# Patient Record
Sex: Male | Born: 1950 | Race: Black or African American | Hispanic: No | Marital: Married | Smoking: Former smoker
Health system: Southern US, Community
[De-identification: ages and names within clinical notes are randomized; demographics above are authoritative.]

## PROBLEM LIST (undated history)

## (undated) DIAGNOSIS — Z972 Presence of dental prosthetic device (complete) (partial): Secondary | ICD-10-CM

## (undated) DIAGNOSIS — C801 Malignant (primary) neoplasm, unspecified: Secondary | ICD-10-CM

## (undated) DIAGNOSIS — M109 Gout, unspecified: Secondary | ICD-10-CM

## (undated) DIAGNOSIS — M1990 Unspecified osteoarthritis, unspecified site: Secondary | ICD-10-CM

## (undated) HISTORY — DX: Unspecified osteoarthritis, unspecified site: M19.90

## (undated) HISTORY — DX: Presence of dental prosthetic device (complete) (partial): Z97.2

---

## 2000-10-20 HISTORY — PX: KNEE SURGERY: SHX244

## 2001-08-25 ENCOUNTER — Encounter: Payer: Self-pay | Admitting: Family Medicine

## 2001-08-25 ENCOUNTER — Ambulatory Visit (HOSPITAL_COMMUNITY): Admission: RE | Admit: 2001-08-25 | Discharge: 2001-08-25 | Payer: Self-pay | Admitting: Family Medicine

## 2006-08-27 ENCOUNTER — Emergency Department (HOSPITAL_COMMUNITY): Admission: EM | Admit: 2006-08-27 | Discharge: 2006-08-27 | Payer: Self-pay | Admitting: Emergency Medicine

## 2009-08-15 ENCOUNTER — Encounter: Admission: RE | Admit: 2009-08-15 | Discharge: 2009-08-15 | Payer: Self-pay | Admitting: Family Medicine

## 2009-10-20 HISTORY — PX: HERNIA REPAIR: SHX51

## 2010-05-08 ENCOUNTER — Emergency Department (HOSPITAL_COMMUNITY): Admission: EM | Admit: 2010-05-08 | Discharge: 2010-05-08 | Payer: Self-pay | Admitting: Emergency Medicine

## 2010-05-30 ENCOUNTER — Encounter: Admission: RE | Admit: 2010-05-30 | Discharge: 2010-05-30 | Payer: Self-pay | Admitting: General Surgery

## 2010-08-13 IMAGING — CR DG CHEST 2V
2 series · 2 of 2 positions shown · non-contrast
Comparison: Chest x-ray of 08/27/2006

CLINICAL DATA: Preop for surgery for repair of inguinal hernia

CHEST - 2 VIEW

[w chest pa]
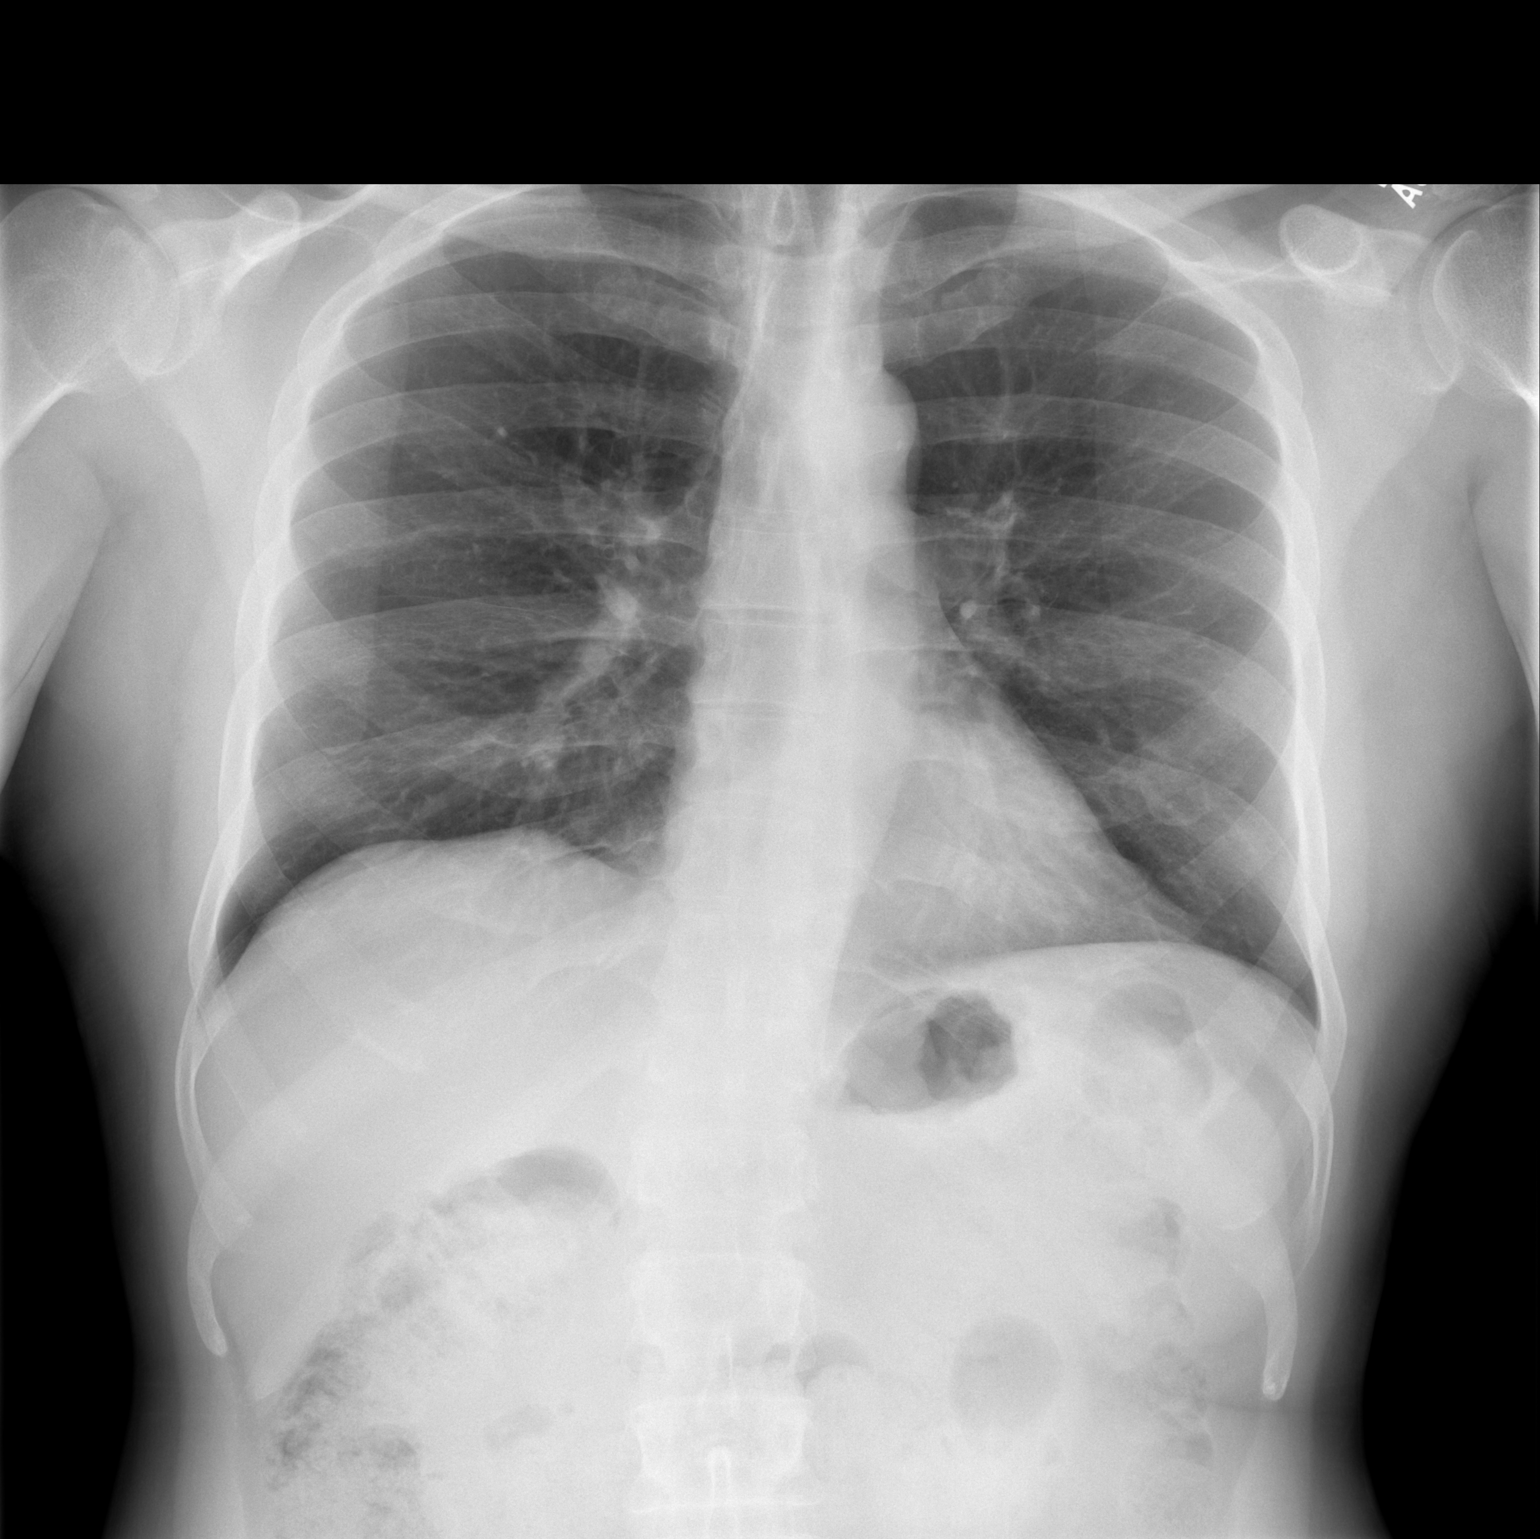

[w chest lat]
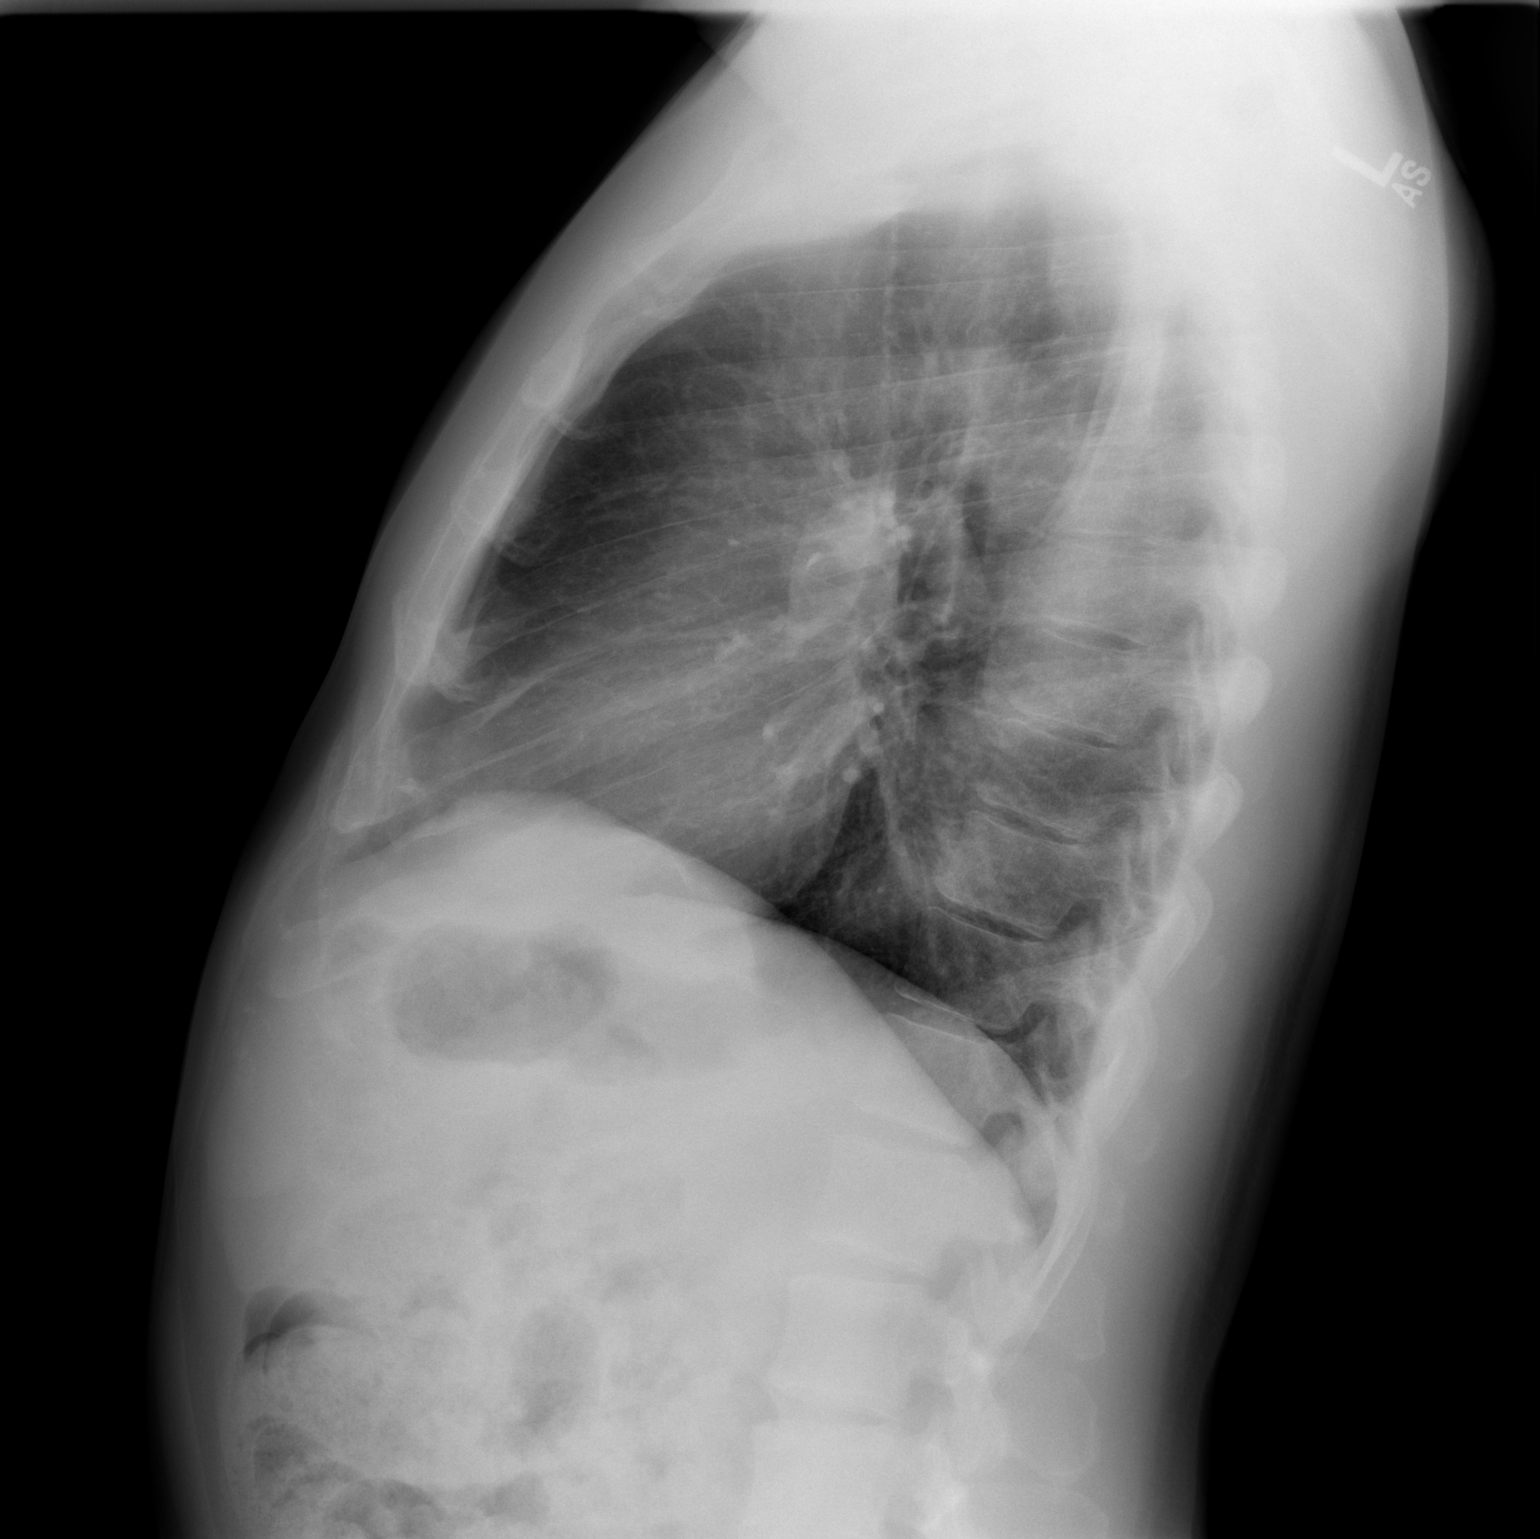

[2 of 2 positions shown; findings below may reference images not displayed]

FINDINGS: The lungs are clear.  Mediastinal contours are normal.
The heart is within normal limits in size.  No bony abnormality is
seen.
IMPRESSION: No active lung disease.

## 2010-11-10 ENCOUNTER — Encounter: Payer: Self-pay | Admitting: Family Medicine

## 2011-01-04 LAB — COMPREHENSIVE METABOLIC PANEL
Albumin: 3.9 g/dL (ref 3.5–5.2)
BUN: 11 mg/dL (ref 6–23)
Calcium: 9 mg/dL (ref 8.4–10.5)
GFR calc Af Amer: >60 mL/min (ref >60)
GFR calc non Af Amer: >60 mL/min (ref >60)
Glucose, Bld: 104 mg/dL — ABNORMAL HIGH (ref 70–99)
Potassium: 3.9 mEq/L (ref 3.5–5.1)
Sodium: 138 mEq/L (ref 135–145)

## 2011-01-04 LAB — DIFFERENTIAL
Basophils Relative: 0 % (ref 0–1)
Eosinophils Relative: 2 % (ref 0–5)
Lymphocytes Relative: 14 % (ref 12–46)
Lymphs Abs: 0.9 10*3/uL (ref 0.7–4.0)
Monocytes Absolute: 0.4 10*3/uL (ref 0.1–1.0)

## 2011-01-04 LAB — CBC
Hemoglobin: 15.2 g/dL (ref 13.0–17.0)
MCH: 28.9 pg (ref 26.0–34.0)
Platelets: 155 10*3/uL (ref 150–400)
RBC: 5.26 MIL/uL (ref 4.22–5.81)
RDW: 14.1 % (ref 11.5–15.5)
WBC: 6.6 10*3/uL (ref 4.0–10.5)

## 2011-03-21 ENCOUNTER — Encounter (INDEPENDENT_AMBULATORY_CARE_PROVIDER_SITE_OTHER): Payer: Self-pay | Admitting: General Surgery

## 2015-07-06 ENCOUNTER — Other Ambulatory Visit: Payer: Self-pay | Admitting: Urology

## 2015-08-24 NOTE — Patient Instructions (Addendum)
Gregory Rhodes  08/24/2015   Your procedure is scheduled on: 08-30-15  Report to [facility]  Entrance take [facility]  elevators to 3rd floor to  [place] at 900 AM.  Call this number if you have problems the morning of surgery [phone]   Remember: ONLY 1 PERSON MAY GO WITH YOU TO SHORT STAY TO GET  READY MORNING OF [place].  Do not eat food or drink liquids :After Midnight.   FOLLOW ANY BOWEL PREP INSTRUCTIONS FROM DR Gregory Rhodes , USE FLEET'S ENEMA NIGHT BEFORE SURGERY !              FOLLOW A CLEAR LIQUID DIET ON Wednesday 08/29/2015 ALL DAY WITH BOWEL PREP!     CLEAR LIQUID DIET   Foods Allowed                                                                     Foods Excluded  Coffee and tea, regular and decaf                             liquids that you cannot  Plain Jell-O in any flavor                                             see through such as: Fruit ices (not with fruit pulp)                                     milk, soups, orange juice  Iced Popsicles                                    All solid food Carbonated beverages, regular and diet                                    Cranberry, grape and apple juices Sports drinks like Gatorade Lightly seasoned clear broth or consume(fat free) Sugar, honey syrup  Sample Menu Breakfast                                Lunch                                     Supper Cranberry juice                    Beef broth                            Chicken broth Jell-O  Grape juice                           Apple juice Coffee or tea                        Jell-O                                      Popsicle                                                Coffee or tea                        Coffee or tea  _____________________________________________________________________     Take these medicines the morning of surgery with A SIP OF WATER:Allopuirinol                                 You may not have any metal on your body including hair pins and              piercings  Do not wear jewelry, make-up, lotions, powders or perfumes, deodorant             Do not wear nail polish.  Do not shave  48 hours prior to surgery.              Men may shave face and neck.   Do not bring valuables to the hospital. Gregory Rhodes.  Contacts, dentures or bridgework may not be worn into surgery.  Leave suitcase in the car. After surgery it may be brought to your room.     Patients discharged the day of surgery will not be allowed to drive home.  Name and phone number of your driver:  Special Instructions: N/A              Please read over the following fact sheets you were given: _____________________________________________________________________             [facility] - Preparing for Surgery Before surgery, you can play an important role.  Because skin is not sterile, your skin needs to be as free of germs as possible.  You can reduce the number of germs on your skin by washing with CHG (chlorahexidine gluconate) soap before surgery.  CHG is an antiseptic cleaner which kills germs and bonds with the skin to continue killing germs even after washing. Please DO NOT use if you have an allergy to CHG or antibacterial soaps.  If your skin becomes reddened/irritated stop using the CHG and inform your nurse when you arrive at Short Stay. Do not shave (including legs and underarms) for at least 48 hours prior to the first CHG shower.  You may shave your face/neck. Please follow these instructions carefully:  1.  Shower with CHG Soap the night before surgery and the  morning of Surgery.  2.  If you choose to wash your hair, wash your hair first as usual with your  normal  shampoo.  3.  After you shampoo, rinse your hair and body thoroughly to remove the  shampoo.                           4.  Use CHG as you would any other liquid  soap.  You can apply chg directly  to the skin and wash                       Gently with a scrungie or clean washcloth.  5.  Apply the CHG Soap to your body ONLY FROM THE NECK DOWN.   Do not use on face/ open                           Wound or open sores. Avoid contact with eyes, ears mouth and genitals (private parts).                       Wash face,  Genitals (private parts) with your normal soap.             6.  Wash thoroughly, paying special attention to the area where your surgery  will be performed.  7.  Thoroughly rinse your body with warm water from the neck down.  8.  DO NOT shower/wash with your normal soap after using and rinsing off  the CHG Soap.                9.  Pat yourself dry with a clean towel.            10.  Wear clean pajamas.            11.  Place clean sheets on your bed the night of your first shower and do not  sleep with pets. Day of Surgery : Do not apply any lotions/deodorants the morning of surgery.  Please wear clean clothes to the hospital/surgery center.  FAILURE TO FOLLOW THESE INSTRUCTIONS MAY RESULT IN THE CANCELLATION OF YOUR SURGERY PATIENT SIGNATURE_________________________________  NURSE SIGNATURE__________________________________  ________________________________________________________________________   Gregory Rhodes  An incentive spirometer is a tool that can help keep your lungs clear and active. This tool measures how well you are filling your lungs with each breath. Taking long deep breaths may help reverse or decrease the chance of developing breathing (pulmonary) problems (especially infection) following:  A long period of time when you are unable to move or be active. BEFORE THE PROCEDURE   If the spirometer includes an indicator to show your best effort, your nurse or respiratory therapist will set it to a desired goal.  If possible, sit up straight or lean slightly forward. Try not to slouch.  Hold the incentive spirometer  in an upright position. INSTRUCTIONS FOR USE   Sit on the edge of your bed if possible, or sit up as far as you can in bed or on a chair.  Hold the incentive spirometer in an upright position.  Breathe out normally.  Place the mouthpiece in your mouth and seal your lips tightly around it.  Breathe in slowly and as deeply as possible, raising the piston or the ball toward the top of the column.  Hold your breath for 3-5 seconds or for as long as possible. Allow the piston or ball to fall to the bottom of the column.  Remove the mouthpiece from your mouth and breathe out normally.  Rest for a few seconds and repeat Steps 1 through 7 at least 10 times every 1-2 hours when you are awake. Take your time and take a few normal breaths between deep breaths.  The spirometer may include an indicator to show your best effort. Use the indicator as a goal to work toward during each repetition.  After each set of 10 deep breaths, practice coughing to be sure your lungs are clear. If you have an incision (the cut made at the time of surgery), support your incision when coughing by placing a pillow or rolled up towels firmly against it. Once you are able to get out of bed, walk around indoors and cough well. You may stop using the incentive spirometer when instructed by your caregiver.  RISKS AND COMPLICATIONS  Take your time so you do not get dizzy or light-headed.  If you are in pain, you may need to take or ask for pain medication before doing incentive spirometry. It is harder to take a deep breath if you are having pain. AFTER USE  Rest and breathe slowly and easily.  It can be helpful to keep track of a log of your progress. Your caregiver can provide you with a simple table to help with this. If you are using the spirometer at home, follow these instructions: [place] IF:   You are having difficultly using the spirometer.  You have trouble using the spirometer as often as  instructed.  Your pain medication is not giving enough relief while using the spirometer.  You develop fever of 100.5 F (38.1 C) or higher. SEEK IMMEDIATE MEDICAL CARE IF:   You cough up bloody sputum that had not been present before.  You develop fever of 102 F (38.9 C) or greater.  You develop worsening pain at or near the incision site. MAKE SURE YOU:   Understand these instructions.  Will watch your condition.  Will get help right away if you are not doing well or get worse. Document Released: 02/16/2007 Document Revised: 12/29/2011 Document Reviewed: 04/19/2007 ExitCare Patient Information 2014 ExitCare, [place].   ________________________________________________________________________  WHAT IS A BLOOD TRANSFUSION? Blood Transfusion Information  A transfusion is the replacement of blood or some of its parts. Blood is made up of multiple cells which provide different functions.  Red blood cells carry oxygen and are used for blood loss replacement.  White blood cells fight against infection.  Platelets control bleeding.  Plasma helps clot blood.  Other blood products are available for specialized needs, such as hemophilia or other clotting disorders. BEFORE THE TRANSFUSION  Who gives blood for transfusions?   Healthy volunteers who are fully evaluated to make sure their blood is safe. This is blood bank blood. Transfusion therapy is the safest it has ever been in the practice of medicine. Before blood is taken from a donor, a complete history is taken to make sure that person has no history of diseases nor engages in risky social behavior (examples are intravenous drug use or sexual activity with multiple partners). The donor's travel history is screened to minimize risk of transmitting infections, such as malaria. The donated blood is tested for signs of infectious diseases, such as HIV and hepatitis. The blood is then tested to be sure it is compatible with you in  order to minimize the chance of a transfusion reaction. If you or a relative donates blood, this is often done in anticipation of surgery and is not appropriate for emergency situations. It takes  many days to process the donated blood. RISKS AND COMPLICATIONS Although transfusion therapy is very safe and saves many lives, the main dangers of transfusion include:   Getting an infectious disease.  Developing a transfusion reaction. This is an allergic reaction to something in the blood you were given. Every precaution is taken to prevent this. The decision to have a blood transfusion has been considered carefully by your caregiver before blood is given. Blood is not given unless the benefits outweigh the risks. AFTER THE TRANSFUSION  Right after receiving a blood transfusion, you will usually feel much better and more energetic. This is especially true if your red blood cells have gotten low (anemic). The transfusion raises the level of the red blood cells which carry oxygen, and this usually causes an energy increase.  The nurse administering the transfusion will monitor you carefully for complications. HOME CARE INSTRUCTIONS  No special instructions are needed after a transfusion. You may find your energy is better. Speak with your caregiver about any limitations on activity for underlying diseases you may have. SEEK MEDICAL CARE IF:   Your condition is not improving after your transfusion.  You develop redness or irritation at the intravenous (IV) site. SEEK IMMEDIATE MEDICAL CARE IF:  Any of the following symptoms occur over the next 12 hours:  Shaking chills.  You have a temperature by mouth above 102 F (38.9 C), not controlled by medicine.  Chest, back, or muscle pain.  People around you feel you are not acting correctly or are confused.  Shortness of breath or difficulty breathing.  Dizziness and fainting.  You get a rash or develop hives.  You have a decrease in urine  output.  Your urine turns a dark color or changes to pink, red, or brown. Any of the following symptoms occur over the next 10 days:  You have a temperature by mouth above 102 F (38.9 C), not controlled by medicine.  Shortness of breath.  Weakness after normal activity.  The white part of the eye turns yellow (jaundice).  You have a decrease in the amount of urine or are urinating less often.  Your urine turns a dark color or changes to pink, red, or brown. Document Released: 10/03/2000 Document Revised: 12/29/2011 Document Reviewed: 05/22/2008 [facility] Patient Information 2014 [place], [place].  _______________________________________________________________________

## 2015-08-27 ENCOUNTER — Encounter (HOSPITAL_COMMUNITY)
Admission: RE | Admit: 2015-08-27 | Discharge: 2015-08-27 | Disposition: A | Discharge status: Home / Self Care | Payer: Commercial Managed Care - PPO | Source: Ambulatory Visit | Attending: Urology | Admitting: Urology

## 2015-08-27 ENCOUNTER — Encounter (HOSPITAL_COMMUNITY): Payer: Self-pay

## 2015-08-27 HISTORY — DX: Malignant (primary) neoplasm, unspecified: C80.1

## 2015-08-27 LAB — BASIC METABOLIC PANEL
Anion gap: 8 (ref 5–15)
BUN: 15 mg/dL (ref 6–20)
CALCIUM: 9.5 mg/dL (ref 8.9–10.3)
CO2: 25 mmol/L (ref 22–32)
CREATININE: 1.13 mg/dL (ref 0.61–1.24)
Chloride: 109 mmol/L (ref 101–111)
GFR calc Af Amer: >60 mL/min (ref >60)
GLUCOSE: 86 mg/dL (ref 65–99)
Potassium: 4.3 mmol/L (ref 3.5–5.1)
Sodium: 142 mmol/L (ref 135–145)

## 2015-08-27 LAB — CBC
HCT: 45.6 % (ref 39.0–52.0)
Hemoglobin: 16.1 g/dL (ref 13.0–17.0)
MCH: 30 pg (ref 26.0–34.0)
MCHC: 35.3 g/dL (ref 30.0–36.0)
MCV: 84.9 fL (ref 78.0–100.0)
Platelets: 156 10*3/uL (ref 150–400)
RBC: 5.37 MIL/uL (ref 4.22–5.81)
RDW: 14 % (ref 11.5–15.5)
WBC: 6.2 10*3/uL (ref 4.0–10.5)

## 2015-08-27 LAB — ABO/RH: ABO/RH(D): O POS

## 2015-08-29 MED ORDER — FLEET ENEMA 7-19 GM/118ML RE ENEM: 1.0000 | ENEMA | Freq: Once | RECTAL | Status: DC

## 2015-08-29 MED ORDER — MAGNESIUM CITRATE PO SOLN
1.0000 | Freq: Once | ORAL | Status: DC
  Filled 2015-08-29: qty 296

## 2015-08-29 NOTE — H&P (Signed)
Chief Complaint Prostate Cancer   Reason For Visit Reason for consult: To discuss treatment options for prostate cancer and specifically to consider a robotic prostatectomy.  Physician requesting consult: Dr. Kathie Rhodes  PCP: Dr. Antony Contras   History of Present Illness Gregory Rhodes is a 64 year old gentleman who was found to have an elevated PSA of 16.2 prompting urologic evaluation by Dr. Karsten Ro. He underwent a prostate needle biopsy on 05/22/15 that demonstrated Gleason 3+4=7 adenocarcinoma of the prostate with 5 out of 12 biopsy cores positive for malignancy. He has no family history of prostate cancer.    His past surgical history is significant for a laparoscopic hernia repair in 2011.    TNM stage: cT1c Nx Mx  PSA: 16.2  Gleason score: 3+4=7  Biopsy (05/22/15): 5/12 cores positive    Left: L lateral apex (30%, 3+3=6), L lateral mid (80%, 3+3=6)    Right: R mid (10%, 3+4=7), R lateral mid (50%, 3+3=6), R lateral base (20%, 3+4=7)  Prostate volume: 25.2 cc    Nomogram  OC disease: 33%  EPE: 65%  SVI: 5%  LNI: 4%  PFS (surgery): 78% at 5 years, 65% at 10 years    Urinary function:   Erectile function:   Past Medical History Problems  1. History of Herniated disc 2. History of gout (Z87.39)  Surgical History Problems  1. History of Biopsy Of The Prostate Needle 2. History of Inguinal Hernia Repair 3. History of Knee Surgery Right  Current Meds 1. Allopurinol 100 MG Oral Tablet;  Therapy: (Recorded:14Jun2016) to Recorded  Allergies Medication  1. No Known Drug Allergies  Family History Problems  1. No pertinent family history : Mother  Social History Problems    Denied: History of Alcohol use   Former smoker [phone])   [id number] years quit in 1983   Number of children   1 son   Occupation   Freight forwarder at [employer], constitutional, skin, eye, otolaryngeal, hematologic/lymphatic, cardiovascular,  pulmonary, endocrine, musculoskeletal, gastrointestinal, neurological and psychiatric system(s) were reviewed and pertinent findings if present are noted and are otherwise negative.    Vitals Vital Signs [Data Includes: Last 1 Day]  Recorded: 13Sep2016 08:02AM  Blood Pressure: 137 / 74 Heart Rate: 62  Physical Exam Constitutional: Well nourished and well developed . No acute distress.  ENT:. The ears and nose are normal in appearance.  Neck: The appearance of the neck is normal and no neck mass is present.  Pulmonary: No respiratory distress, normal respiratory rhythm and effort and clear bilateral breath sounds.  Cardiovascular: Heart rate and rhythm are normal . No peripheral edema.  Abdomen: The abdomen is soft and nontender. No masses are palpated. No CVA tenderness. No hernias are palpable. No hepatosplenomegaly noted.  Rectal: Rectal exam demonstrates normal sphincter tone, no tenderness and no masses. He does have a subtle nodule toward the right mid apex which is certainly organ confined medially. The prostate is not tender. The left seminal vesicle is nonpalpable. The right seminal vesicle is nonpalpable. The perineum is normal on inspection.  Lymphatics: The femoral and inguinal nodes are not enlarged or tender.  Skin: Normal skin turgor, no visible rash and no visible skin lesions.  Neuro/Psych:. Mood and affect are appropriate.    Results/Data Urine [Data Includes: Last 1 Day]   13Sep2016  COLOR YELLOW   APPEARANCE CLEAR   SPECIFIC GRAVITY 1.020   pH 6.0   GLUCOSE NEGATIVE   BILIRUBIN NEGATIVE   KETONE  NEGATIVE   BLOOD NEGATIVE   PROTEIN NEGATIVE   NITRITE NEGATIVE   LEUKOCYTE ESTERASE NEGATIVE    I have reviewed his medical records, PSA results, and pathology results. Findings are as dictated above.   Assessment Assessed  1. Adenocarcinoma of prostate (C61)  Plan Adenocarcinoma of prostate  1. Follow-up Office  Follow-up  Status: Complete  Done: 13Sep2016 2.  PT/OT Referral Referral  Referral  Status: Hold For - PreCert,Date of Service,Physical  Therapy  Requested for: 21Sep2016 Health Maintenance  3. UA With REFLEX; [Do Not Release]; Status:Complete;   Done: [id number] 07:49AM  Discussion/Summary 1. Intermediate risk clinically localized prostate cancer: He has elected to proceed with surgical treatment. He will undergo a bilateral nerve sparing robot-assisted laparoscopic radical prostatectomy and pelvic lymphadenectomy. He did undergo a prior laparoscopic hernia repair and we did discuss the specific increased risks of possible open surgical conversion, bladder injury, or vascular injury related to his prior surgical procedure.

## 2015-08-30 ENCOUNTER — Inpatient Hospital Stay (HOSPITAL_COMMUNITY): Payer: Commercial Managed Care - PPO | Admitting: Registered Nurse

## 2015-08-30 ENCOUNTER — Encounter (HOSPITAL_COMMUNITY)
Admission: RE | Disposition: A | Discharge status: Home / Self Care | Payer: Self-pay | Source: Ambulatory Visit | Attending: Urology

## 2015-08-30 ENCOUNTER — Encounter (HOSPITAL_COMMUNITY): Payer: Self-pay | Admitting: *Deleted

## 2015-08-30 ENCOUNTER — Inpatient Hospital Stay (HOSPITAL_COMMUNITY)
Admission: RE | Admit: 2015-08-30 | Discharge: 2015-08-31 | DRG: 708 | Disposition: A | Discharge status: Home / Self Care | Payer: Commercial Managed Care - PPO | Source: Ambulatory Visit | Attending: Urology | Admitting: Urology

## 2015-08-30 DIAGNOSIS — Z87891 Personal history of nicotine dependence: Secondary | ICD-10-CM | POA: Diagnosis not present

## 2015-08-30 DIAGNOSIS — K660 Peritoneal adhesions (postprocedural) (postinfection): Secondary | ICD-10-CM | POA: Diagnosis present

## 2015-08-30 DIAGNOSIS — Z01812 Encounter for preprocedural laboratory examination: Secondary | ICD-10-CM | POA: Diagnosis not present

## 2015-08-30 DIAGNOSIS — C61 Malignant neoplasm of prostate: Secondary | ICD-10-CM | POA: Diagnosis present

## 2015-08-30 HISTORY — PX: LYMPHADENECTOMY: SHX5960

## 2015-08-30 HISTORY — PX: ROBOT ASSISTED LAPAROSCOPIC RADICAL PROSTATECTOMY: SHX5141

## 2015-08-30 HISTORY — DX: Gout, unspecified: M10.9

## 2015-08-30 LAB — TYPE AND SCREEN
ABO/RH(D): O POS
ANTIBODY SCREEN: NEGATIVE

## 2015-08-30 LAB — HEMOGLOBIN AND HEMATOCRIT, BLOOD
HEMATOCRIT: 44.5 % (ref 39.0–52.0)
HEMOGLOBIN: 15.4 g/dL (ref 13.0–17.0)

## 2015-08-30 SURGERY — ROBOTIC ASSISTED LAPAROSCOPIC RADICAL PROSTATECTOMY LEVEL 3
Anesthesia: General

## 2015-08-30 MED ORDER — PROPOFOL 10 MG/ML IV BOLUS
INTRAVENOUS | Status: DC | PRN
  Administered 2015-08-30: 140 mg via INTRAVENOUS

## 2015-08-30 MED ORDER — KCL IN DEXTROSE-NACL 20-5-0.45 MEQ/L-%-% IV SOLN
INTRAVENOUS | Status: DC
  Administered 2015-08-30 – 2015-08-31 (×3): via INTRAVENOUS
  Filled 2015-08-30 (×4): qty 1000

## 2015-08-30 MED ORDER — SULFAMETHOXAZOLE-TRIMETHOPRIM 800-160 MG PO TABS: 1.0000 | ORAL_TABLET | Freq: Two times a day (BID) | ORAL | Status: AC

## 2015-08-30 MED ORDER — HYDROMORPHONE HCL 1 MG/ML IJ SOLN
INTRAMUSCULAR | Status: AC
  Filled 2015-08-30: qty 1

## 2015-08-30 MED ORDER — DOCUSATE SODIUM 100 MG PO CAPS
100.0000 mg | ORAL_CAPSULE | Freq: Two times a day (BID) | ORAL | Status: DC
  Administered 2015-08-30 – 2015-08-31 (×2): 100 mg via ORAL
  Filled 2015-08-30 (×3): qty 1

## 2015-08-30 MED ORDER — FENTANYL CITRATE (PF) 100 MCG/2ML IJ SOLN
INTRAMUSCULAR | Status: DC | PRN
  Administered 2015-08-30 (×2): 50 ug via INTRAVENOUS
  Administered 2015-08-30: 100 ug via INTRAVENOUS
  Administered 2015-08-30: 50 ug via INTRAVENOUS

## 2015-08-30 MED ORDER — DIPHENHYDRAMINE HCL 50 MG/ML IJ SOLN: 12.5000 mg | Freq: Four times a day (QID) | INTRAMUSCULAR | Status: DC | PRN

## 2015-08-30 MED ORDER — MIDAZOLAM HCL 5 MG/5ML IJ SOLN
INTRAMUSCULAR | Status: DC | PRN
  Administered 2015-08-30: 2 mg via INTRAVENOUS

## 2015-08-30 MED ORDER — MIDAZOLAM HCL 2 MG/2ML IJ SOLN
INTRAMUSCULAR | Status: AC
  Filled 2015-08-30: qty 4

## 2015-08-30 MED ORDER — SODIUM CHLORIDE 0.9 % IR SOLN
Status: DC | PRN
  Administered 2015-08-30: 1000 mL via INTRAVESICAL

## 2015-08-30 MED ORDER — NEOSTIGMINE METHYLSULFATE 10 MG/10ML IV SOLN
INTRAVENOUS | Status: DC | PRN
  Administered 2015-08-30: 4 mg via INTRAVENOUS

## 2015-08-30 MED ORDER — LACTATED RINGERS IV SOLN
INTRAVENOUS | Status: DC
  Administered 2015-08-30: 14:00:00 via INTRAVENOUS
  Administered 2015-08-30: 1000 mL via INTRAVENOUS

## 2015-08-30 MED ORDER — HYDROMORPHONE HCL 1 MG/ML IJ SOLN
0.2500 mg | INTRAMUSCULAR | Status: DC | PRN
  Administered 2015-08-30 (×4): 0.5 mg via INTRAVENOUS

## 2015-08-30 MED ORDER — PROPOFOL 10 MG/ML IV BOLUS
INTRAVENOUS | Status: AC
  Filled 2015-08-30: qty 20

## 2015-08-30 MED ORDER — HYDROMORPHONE HCL 2 MG/ML IJ SOLN
INTRAMUSCULAR | Status: AC
  Filled 2015-08-30: qty 1

## 2015-08-30 MED ORDER — LIDOCAINE HCL (CARDIAC) 20 MG/ML IV SOLN
INTRAVENOUS | Status: DC | PRN
  Administered 2015-08-30: 50 mg via INTRAVENOUS

## 2015-08-30 MED ORDER — LACTATED RINGERS IV SOLN
INTRAVENOUS | Status: DC | PRN
  Administered 2015-08-30: 12:00:00

## 2015-08-30 MED ORDER — SODIUM CHLORIDE 0.9 % IV BOLUS (SEPSIS)
1000.0000 mL | Freq: Once | INTRAVENOUS | Status: AC
  Administered 2015-08-30: 1000 mL via INTRAVENOUS

## 2015-08-30 MED ORDER — BUPIVACAINE-EPINEPHRINE (PF) 0.25% -1:200000 IJ SOLN
INTRAMUSCULAR | Status: AC
  Filled 2015-08-30: qty 30

## 2015-08-30 MED ORDER — CEFAZOLIN SODIUM-DEXTROSE 2-3 GM-% IV SOLR
2.0000 g | INTRAVENOUS | Status: AC
  Administered 2015-08-30: 2 g via INTRAVENOUS

## 2015-08-30 MED ORDER — ROCURONIUM BROMIDE 100 MG/10ML IV SOLN
INTRAVENOUS | Status: DC | PRN
  Administered 2015-08-30: 10 mg via INTRAVENOUS
  Administered 2015-08-30: 50 mg via INTRAVENOUS

## 2015-08-30 MED ORDER — STERILE WATER FOR IRRIGATION IR SOLN
Status: DC | PRN
  Administered 2015-08-30: 1000 mL

## 2015-08-30 MED ORDER — MORPHINE SULFATE (PF) 2 MG/ML IV SOLN
2.0000 mg | INTRAVENOUS | Status: DC | PRN
  Administered 2015-08-30 (×2): 2 mg via INTRAVENOUS
  Administered 2015-08-31: 4 mg via INTRAVENOUS
  Administered 2015-08-31: 2 mg via INTRAVENOUS
  Administered 2015-08-31 (×3): 4 mg via INTRAVENOUS
  Filled 2015-08-30 (×3): qty 2
  Filled 2015-08-30 (×2): qty 1
  Filled 2015-08-30: qty 2
  Filled 2015-08-30: qty 1

## 2015-08-30 MED ORDER — ACETAMINOPHEN 325 MG PO TABS: 650.0000 mg | ORAL_TABLET | ORAL | Status: DC | PRN

## 2015-08-30 MED ORDER — KCL IN DEXTROSE-NACL 20-5-0.45 MEQ/L-%-% IV SOLN
INTRAVENOUS | Status: AC
  Filled 2015-08-30: qty 1000

## 2015-08-30 MED ORDER — ONDANSETRON HCL 4 MG/2ML IJ SOLN
INTRAMUSCULAR | Status: DC | PRN
  Administered 2015-08-30: 4 mg via INTRAVENOUS

## 2015-08-30 MED ORDER — ONDANSETRON HCL 4 MG/2ML IJ SOLN
INTRAMUSCULAR | Status: AC
  Filled 2015-08-30: qty 2

## 2015-08-30 MED ORDER — LIDOCAINE HCL (CARDIAC) 20 MG/ML IV SOLN
INTRAVENOUS | Status: AC
Start: 2015-08-30 — End: 2015-08-30
  Filled 2015-08-30: qty 5

## 2015-08-30 MED ORDER — SUCCINYLCHOLINE CHLORIDE 20 MG/ML IJ SOLN
INTRAMUSCULAR | Status: DC | PRN
  Administered 2015-08-30: 80 mg via INTRAVENOUS

## 2015-08-30 MED ORDER — GLYCOPYRROLATE 0.2 MG/ML IJ SOLN
INTRAMUSCULAR | Status: DC | PRN
  Administered 2015-08-30: 0.6 mg via INTRAVENOUS

## 2015-08-30 MED ORDER — ROCURONIUM BROMIDE 100 MG/10ML IV SOLN
INTRAVENOUS | Status: AC
  Filled 2015-08-30: qty 1

## 2015-08-30 MED ORDER — BELLADONNA ALKALOIDS-OPIUM 16.2-60 MG RE SUPP
1.0000 | Freq: Four times a day (QID) | RECTAL | Status: DC | PRN
  Administered 2015-08-30: 1 via RECTAL
  Filled 2015-08-30: qty 1

## 2015-08-30 MED ORDER — DIPHENHYDRAMINE HCL 12.5 MG/5ML PO ELIX: 12.5000 mg | ORAL_SOLUTION | Freq: Four times a day (QID) | ORAL | Status: DC | PRN

## 2015-08-30 MED ORDER — CEFAZOLIN SODIUM-DEXTROSE 2-3 GM-% IV SOLR
INTRAVENOUS | Status: AC
  Filled 2015-08-30: qty 50

## 2015-08-30 MED ORDER — HYDROCODONE-ACETAMINOPHEN 5-325 MG PO TABS: 1.0000 | ORAL_TABLET | Freq: Four times a day (QID) | ORAL | Status: AC | PRN

## 2015-08-30 MED ORDER — CEFAZOLIN SODIUM 1-5 GM-% IV SOLN
1.0000 g | Freq: Three times a day (TID) | INTRAVENOUS | Status: AC
  Administered 2015-08-30 – 2015-08-31 (×2): 1 g via INTRAVENOUS
  Filled 2015-08-30 (×2): qty 50

## 2015-08-30 MED ORDER — HEPARIN SODIUM (PORCINE) 1000 UNIT/ML IJ SOLN
INTRAMUSCULAR | Status: AC
  Filled 2015-08-30: qty 1

## 2015-08-30 MED ORDER — PROMETHAZINE HCL 25 MG/ML IJ SOLN: 6.2500 mg | INTRAMUSCULAR | Status: DC | PRN

## 2015-08-30 MED ORDER — BUPIVACAINE-EPINEPHRINE 0.25% -1:200000 IJ SOLN
INTRAMUSCULAR | Status: DC | PRN
  Administered 2015-08-30: 30 mL

## 2015-08-30 MED ORDER — KETOROLAC TROMETHAMINE 15 MG/ML IJ SOLN
15.0000 mg | Freq: Four times a day (QID) | INTRAMUSCULAR | Status: DC
Start: 2015-08-30 — End: 2015-08-31
  Administered 2015-08-30 – 2015-08-31 (×4): 15 mg via INTRAVENOUS
  Filled 2015-08-30 (×6): qty 1

## 2015-08-30 MED ORDER — HYDROMORPHONE HCL 1 MG/ML IJ SOLN
INTRAMUSCULAR | Status: DC | PRN
  Administered 2015-08-30 (×3): 0.5 mg via INTRAVENOUS

## 2015-08-30 MED ORDER — FENTANYL CITRATE (PF) 250 MCG/5ML IJ SOLN
INTRAMUSCULAR | Status: AC
  Filled 2015-08-30: qty 25

## 2015-08-30 MED ORDER — ALLOPURINOL 100 MG PO TABS
200.0000 mg | ORAL_TABLET | Freq: Every day | ORAL | Status: DC
  Filled 2015-08-30: qty 2

## 2015-08-30 SURGICAL SUPPLY — 49 items
CABLE HIGH FREQUENCY MONO STRZ (ELECTRODE) ×4 IMPLANT
CATH FOLEY 2WAY SLVR 18FR 30CC (CATHETERS) ×4 IMPLANT
CATH ROBINSON RED A/P 16FR (CATHETERS) ×4 IMPLANT
CATH ROBINSON RED A/P 8FR (CATHETERS) ×4 IMPLANT
CATH TIEMANN FOLEY 18FR 5CC (CATHETERS) ×4 IMPLANT
CHLORAPREP W/TINT 26ML (MISCELLANEOUS) ×4 IMPLANT
CLIP LIGATING HEM O LOK PURPLE (MISCELLANEOUS) ×10 IMPLANT
CLOTH BEACON ORANGE TIMEOUT ST (SAFETY) ×4 IMPLANT
COVER SURGICAL LIGHT HANDLE (MISCELLANEOUS) ×4 IMPLANT
COVER TIP SHEARS 8 DVNC (MISCELLANEOUS) ×2 IMPLANT
COVER TIP SHEARS 8MM DA VINCI (MISCELLANEOUS) ×2
CUTTER ECHEON FLEX ENDO 45 340 (ENDOMECHANICALS) ×4 IMPLANT
DECANTER SPIKE VIAL GLASS SM (MISCELLANEOUS) ×2 IMPLANT
DRAPE SURG IRRIG POUCH 19X23 (DRAPES) ×4 IMPLANT
DRSG TEGADERM 4X4.75 (GAUZE/BANDAGES/DRESSINGS) ×4 IMPLANT
DRSG TEGADERM 6X8 (GAUZE/BANDAGES/DRESSINGS) ×4 IMPLANT
ELECT REM PT RETURN 9FT ADLT (ELECTROSURGICAL) ×4
ELECTRODE REM PT RTRN 9FT ADLT (ELECTROSURGICAL) ×2 IMPLANT
GLOVE BIO SURGEON STRL SZ 6.5 (GLOVE) ×3 IMPLANT
GLOVE BIO SURGEONS STRL SZ 6.5 (GLOVE) ×1
GLOVE BIOGEL M STRL SZ7.5 (GLOVE) ×8 IMPLANT
GOWN STRL REUS W/TWL LRG LVL3 (GOWN DISPOSABLE) ×12 IMPLANT
HOLDER FOLEY CATH W/STRAP (MISCELLANEOUS) ×4 IMPLANT
IV LACTATED RINGERS 1000ML (IV SOLUTION) ×2 IMPLANT
KIT ACCESSORY DA VINCI DISP (KITS) ×2
KIT ACCESSORY DVNC DISP (KITS) ×2 IMPLANT
LIQUID BAND (GAUZE/BANDAGES/DRESSINGS) IMPLANT
NDL SAFETY ECLIPSE 18X1.5 (NEEDLE) ×2 IMPLANT
NEEDLE HYPO 18GX1.5 SHARP (NEEDLE) ×4
PACK ROBOT UROLOGY CUSTOM (CUSTOM PROCEDURE TRAY) ×4 IMPLANT
RELOAD GREEN ECHELON 45 (STAPLE) ×4 IMPLANT
SET TUBE IRRIG SUCTION NO TIP (IRRIGATION / IRRIGATOR) ×4 IMPLANT
SHEET LAVH (DRAPES) ×2 IMPLANT
SOLUTION ELECTROLUBE (MISCELLANEOUS) ×4 IMPLANT
SUT ETHILON 3 0 PS 1 (SUTURE) ×4 IMPLANT
SUT MNCRL 3 0 RB1 (SUTURE) ×2 IMPLANT
SUT MNCRL 3 0 VIOLET RB1 (SUTURE) ×2 IMPLANT
SUT MNCRL AB 4-0 PS2 18 (SUTURE) ×8 IMPLANT
SUT MONOCRYL 3 0 RB1 (SUTURE) ×4
SUT VIC AB 0 CT1 27 (SUTURE) ×4
SUT VIC AB 0 CT1 27XBRD ANTBC (SUTURE) ×2 IMPLANT
SUT VIC AB 0 UR5 27 (SUTURE) ×4 IMPLANT
SUT VIC AB 2-0 SH 27 (SUTURE) ×4
SUT VIC AB 2-0 SH 27X BRD (SUTURE) ×2 IMPLANT
SUT VICRYL 0 UR6 27IN ABS (SUTURE) ×8 IMPLANT
SYR 27GX1/2 1ML LL SAFETY (SYRINGE) ×4 IMPLANT
TOWEL OR 17X26 10 PK STRL BLUE (TOWEL DISPOSABLE) ×4 IMPLANT
TOWEL OR NON WOVEN STRL DISP B (DISPOSABLE) ×4 IMPLANT
WATER STERILE IRR 1500ML POUR (IV SOLUTION) ×4 IMPLANT

## 2015-08-30 NOTE — Discharge Instructions (Signed)

## 2015-08-30 NOTE — Anesthesia Preprocedure Evaluation (Addendum)
Anesthesia Evaluation  Patient identified by MRN, date of birth, ID band Patient awake    Reviewed: Allergy & Precautions, NPO status , Patient's Chart, lab work & pertinent test results  Airway Mallampati: II  TM Distance: >3 FB Neck ROM: Full    Dental no notable dental hx.    Pulmonary former smoker,    Pulmonary exam normal breath sounds clear to auscultation       Cardiovascular negative cardio ROS Normal cardiovascular exam Rhythm:Regular Rate:Normal     Neuro/Psych negative neurological ROS  negative psych ROS   GI/Hepatic negative GI ROS, Neg liver ROS,   Endo/Other  negative endocrine ROS  Renal/GU negative Renal ROS  negative genitourinary   Musculoskeletal  (+) Arthritis ,   Abdominal   Peds negative pediatric ROS (+)  Hematology negative hematology ROS (+)   Anesthesia Other Findings   Reproductive/Obstetrics negative OB ROS                            Anesthesia Physical Anesthesia Plan  ASA: II  Anesthesia Plan: General   Post-op Pain Management:    Induction: Intravenous  Airway Management Planned: Oral ETT  Additional Equipment:   Intra-op Plan:   Post-operative Plan: Extubation in OR  Informed Consent: I have reviewed the patients History and Physical, chart, labs and discussed the procedure including the risks, benefits and alternatives for the proposed anesthesia with the patient or authorized representative who has indicated his/her understanding and acceptance.   Dental advisory given  Plan Discussed with: CRNA  Anesthesia Plan Comments:         Anesthesia Quick Evaluation

## 2015-08-30 NOTE — Progress Notes (Signed)
Patient ID: DERRYCK LONGER, male   DOB: 1950-12-24, 64 y.o.   MRN: [id number]  Post-op note  Subjective: The patient is doing well.  No complaints.  Objective: Vital signs in last 24 hours: Temp:  [97 F (36.1 C)-98.5 F (36.9 C)] 97 F (36.1 C) (11/10 1545) Pulse Rate:  [62-82] 68 (11/10 1615) Resp:  [13-18] 16 (11/10 1615) BP: (121-143)/(68-79) 138/68 mmHg (11/10 1615) SpO2:  [98 %-100 %] 99 % (11/10 1615) Weight:  [84.482 kg (186 lb 4 oz)] 84.482 kg (186 lb 4 oz) (11/10 0926)  Intake/Output from previous day:   Intake/Output this shift: Total I/O In: 3200 [I.V.:2200; IV Piggyback:1000] Out: 200 [Drains:50; Blood:150]  Physical Exam:  General: Alert and oriented. Abdomen: Soft, Nondistended. Incisions: Clean and dry. GU: Urine is draining well, red tinged.  Lab Results:  Recent Labs  08/30/15 1518  HGB 15.4  HCT 44.5    Assessment/Plan: POD#0   1) Continue to monitor, ambulate twice tonight, IS   Pryor Curia. MD   LOS: 0 days   Hanah Moultry,LES 08/30/2015, 4:22 PM

## 2015-08-30 NOTE — Anesthesia Procedure Notes (Signed)
Procedure Name: Intubation Performed by: Charis Juliana J Pre-anesthesia Checklist: Patient identified, Emergency Drugs available, Suction available, Patient being monitored and Timeout performed Patient Re-evaluated:Patient Re-evaluated prior to inductionOxygen Delivery Method: Circle system utilized Preoxygenation: Pre-oxygenation with 100% oxygen Intubation Type: IV induction Ventilation: Mask ventilation without difficulty Laryngoscope Size: Mac and 4 Grade View: Grade I Tube type: Oral Number of attempts: 1 Airway Equipment and Method: Stylet Placement Confirmation: ETT inserted through vocal cords under direct vision,  positive ETCO2,  CO2 detector and breath sounds checked- equal and bilateral Secured at: 23 cm Tube secured with: Tape Dental Injury: Teeth and Oropharynx as per pre-operative assessment      

## 2015-08-30 NOTE — Plan of Care (Signed)
Problem: Respiratory: Goal: Ability to achieve and maintain a regular respiratory rate will improve Outcome: Progressing Instructed on cough, deep breathe and IS use

## 2015-08-30 NOTE — Progress Notes (Signed)
Received pt from PACU, easily arrousable but sleepy, IVFs infusing, SCDs intact, JP drain and F/C intact, oriented to unit, call light placed with in reach

## 2015-08-30 NOTE — Op Note (Signed)
Preoperative diagnosis: Clinically localized adenocarcinoma of the prostate (clinical stage T1c Nx Mx)  Postoperative diagnosis: Clinically localized adenocarcinoma of the prostate (clinical stage T1c Nx Mx)  Procedure:  1. Robotic assisted laparoscopic radical prostatectomy (bilateral nerve sparing) 2. Bilateral robotic assisted laparoscopic pelvic lymphadenectomy  Surgeon: Pryor Curia. M.D.  Assistant: Debbrah Alar, PA-C  Anesthesia: General  Complications: None  EBL: 150 mL  IVF:  2000 mL crystalloid  Specimens: 1. Prostate and seminal vesicles 2. Right pelvic lymph nodes 3. Left pelvic lymph nodes  Disposition of specimens: Pathology  Drains: 1. 20 Fr coude catheter 2. # 19 Blake pelvic drain  Indication: Gregory Rhodes is a 64 y.o. year old patient with clinically localized prostate cancer.  After a thorough review of the management options for treatment of prostate cancer, he elected to proceed with surgical therapy and the above procedure(s).  We have discussed the potential benefits and risks of the procedure, side effects of the proposed treatment, the likelihood of the patient achieving the goals of the procedure, and any potential problems that might occur during the procedure or recuperation. Informed consent has been obtained.  Description of procedure:  The patient was taken to the operating room and a general anesthetic was administered. He was given preoperative antibiotics, placed in the dorsal lithotomy position, and prepped and draped in the usual sterile fashion. Next a preoperative timeout was performed. A urethral catheter was placed into the bladder and a site was selected near the umbilicus for placement of the camera port. This was placed using a standard open Hassan technique which allowed entry into the peritoneal cavity under direct vision and without difficulty. A 12 mm port was placed and a pneumoperitoneum established. The camera was then  used to inspect the abdomen and there was no evidence of any intra-abdominal injuries or other abnormalities except for adhesions between the omentum and peritoneum in the left lower quadrant. The patient's hernia mesh was visualized in the left inguinal region.  The remaining abdominal ports were then placed. 8 mm robotic ports were placed in the right lower quadrant, left lower quadrant, and far left lateral abdominal wall. A 5 mm port was placed in the right upper quadrant and a 12 mm port was placed in the right lateral abdominal wall for laparoscopic assistance. All ports were placed under direct vision without difficulty. The surgical cart was then docked.   Utilizing the cautery scissors, the bladder was reflected posteriorly allowing entry into the space of Retzius and identification of the endopelvic fascia and prostate. On the left there was sense adherent desmoplastic reaction with the mesh overlying the internal ring and extending down over the iliac vessels and toward the midline beloew the pubic symphisis. This required careful and tedious dissection to expose the pelvic sidewall and prostate.   Attention then turned to the right pelvic sidewall. The fibrofatty tissue between the external iliac vein, confluence of the iliac vessels, hypogastric artery, and Cooper's ligament was dissected free from the pelvic sidewall with care to preserve the obturator nerve. Weck clips were used for lymphostasis and hemostasis. An identical procedure was performed, albeit with increased complexity due to the adhesions and scarring from the hernia mesh, on the contralateral side and the lymphatic packets were removed for permanent pathologic analysis.   The periprostatic fat was then removed from the prostate allowing full exposure of the endopelvic fascia. The endopelvic fascia was then incised from the apex back to the base of the prostate bilaterally  and the underlying levator muscle fibers were swept  laterally off the prostate thereby isolating the dorsal venous complex. The dorsal vein was then stapled and divided with a 45 mm Flex Echelon stapler. Attention then turned to the bladder neck which was divided anteriorly thereby allowing entry into the bladder and exposure of the urethral catheter. The catheter balloon was deflated and the catheter was brought into the operative field and used to retract the prostate anteriorly. The posterior bladder neck was then examined and was divided allowing further dissection between the bladder and prostate posteriorly until the vasa deferentia and seminal vessels were identified. The vasa deferentia were isolated, divided, and lifted anteriorly. The seminal vesicles were dissected down to their tips with care to control the seminal vascular arterial blood supply. These structures were then lifted anteriorly and the space between Denonvillier's fascia and the anterior rectum was developed with a combination of sharp and blunt dissection. This isolated the vascular pedicles of the prostate.  The lateral prostatic fascia was then sharply incised allowing release of the neurovascular bundles bilaterally. The vascular pedicles of the prostate were then ligated with Weck clips between the prostate and neurovascular bundles and divided with sharp cold scissor dissection resulting in neurovascular bundle preservation. The neurovascular bundles were then separated off the apex of the prostate and urethra bilaterally.  The urethra was then sharply transected allowing the prostate specimen to be disarticulated. The pelvis was copiously irrigated and hemostasis was ensured. There was no evidence for rectal injury.  Attention then turned to the urethral anastomosis. A 2-0 Vicryl slip knot was placed between Denonvillier's fascia, the posterior bladder neck, and the posterior urethra to reapproximate these structures. A double-armed 3-0 Monocryl suture was then used to perform a  360 running tension-free anastomosis between the bladder neck and urethra. A new urethral catheter was then placed into the bladder and irrigated. There were no blood clots within the bladder and the anastomosis appeared to be watertight. A #19 Blake drain was then brought through the left lateral 8 mm port site and positioned appropriately within the pelvis. It was secured to the skin with a nylon suture. The surgical cart was then undocked. The right lateral 12 mm port site was closed at the fascial level with a 0 Vicryl suture placed laparoscopically. All remaining ports were then removed under direct vision. The prostate specimen was removed intact within the Endopouch retrieval bag via the periumbilical camera port site. This fascial opening was closed with two running 0 Vicryl sutures. 0.25% Marcaine was then injected into all port sites and all incisions were reapproximated at the skin level with 4-0 Monocryl subcuticular sutures and Dermabond. The patient appeared to tolerate the procedure well and without complications. The patient was able to be extubated and transferred to the recovery unit in satisfactory condition.   Pryor Curia MD

## 2015-08-30 NOTE — Transfer of Care (Signed)
Immediate Anesthesia Transfer of Care Note  Patient: Gregory Rhodes  Procedure(s) Performed: Procedure(s): ROBOTIC ASSISTED LAPAROSCOPIC RADICAL PROSTATECTOMY LEVEL 3 (N/A) LYMPHADENECTOMY (Bilateral)  Patient Location: PACU  Anesthesia Type:General  Level of Consciousness: awake, alert  and oriented  Airway & Oxygen Therapy: Patient Spontanous Breathing and Patient connected to face mask oxygen  Post-op Assessment: Report given to RN and Post -op Vital signs reviewed and stable  Post vital signs: Reviewed and stable  Last Vitals:  Filed Vitals:   08/30/15 0906  BP: 121/68  Pulse: 72  Temp: 36.9 C  Resp: 18    Complications: No apparent anesthesia complications

## 2015-08-31 LAB — HEMOGLOBIN AND HEMATOCRIT, BLOOD
HCT: 39.5 % (ref 39.0–52.0)
HEMOGLOBIN: 13.5 g/dL (ref 13.0–17.0)

## 2015-08-31 MED ORDER — BISACODYL 10 MG RE SUPP
10.0000 mg | RECTAL | Status: AC
  Administered 2015-08-31: 10 mg via RECTAL
  Filled 2015-08-31: qty 1

## 2015-08-31 MED ORDER — HYDROCODONE-ACETAMINOPHEN 5-325 MG PO TABS
1.0000 | ORAL_TABLET | Freq: Four times a day (QID) | ORAL | Status: DC | PRN
  Administered 2015-08-31: 2 via ORAL
  Filled 2015-08-31: qty 2

## 2015-08-31 NOTE — Anesthesia Postprocedure Evaluation (Signed)
  Anesthesia Post-op Note  Patient: Gregory Rhodes  Procedure(s) Performed: Procedure(s): ROBOTIC ASSISTED LAPAROSCOPIC RADICAL PROSTATECTOMY LEVEL 3 (N/A) LYMPHADENECTOMY (Bilateral)  Patient Location: PACU  Anesthesia Type:General  Level of Consciousness: awake  Airway and Oxygen Therapy: Patient Spontanous Breathing  Post-op Pain: mild  Post-op Assessment: Post-op Vital signs reviewed, Patient's Cardiovascular Status Stable, Respiratory Function Stable, Patent Airway, No signs of Nausea or vomiting and Pain level controlled              Post-op Vital Signs: Reviewed and stable  Last Vitals:  Filed Vitals:   08/31/15 1434  BP: 125/61  Pulse: 76  Temp: 36.8 C  Resp: 16    Complications: No apparent anesthesia complications

## 2015-08-31 NOTE — Progress Notes (Signed)
Patient and his wife given discharge, medication and f/u instructions, verbalized understanding, IV removed, f/c leg drainage bag and strap given to take home, family to transport home

## 2015-08-31 NOTE — Progress Notes (Signed)
1 Day Post-Op Subjective: The patient is doing well.  No nausea or vomiting. Pain is adequately controlled.  Objective: Vital signs in last 24 hours: Temp:  [97 F (36.1 C)-99.5 F (37.5 C)] 99.5 F (37.5 C) (11/11 0503) Pulse Rate:  [60-82] 68 (11/11 0503) Resp:  [11-18] 16 (11/11 0503) BP: (98-143)/(51-79) 98/51 mmHg (11/11 0503) SpO2:  [93 %-100 %] 93 % (11/11 0503) Weight:  [84.482 kg (186 lb 4 oz)] 84.482 kg (186 lb 4 oz) (11/10 0926)  Intake/Output from previous day: 11/10 0701 - 11/11 0700 In: 5360 [I.V.:4260; IV Piggyback:1100] Out: 1155 [Urine:850; Drains:155; Blood:150] Intake/Output this shift:    Physical Exam:  General: Alert and oriented. CV: RRR Lungs: Clear bilaterally. GI: Soft, Nondistended. Incisions: Clean, dry, and intact Urine: Clear Extremities: Nontender, no erythema, no edema.  Lab Results:  Recent Labs  08/30/15 1518 08/31/15 0528  HGB 15.4 13.5  HCT 44.5 39.5      Assessment/Plan: POD# 1 s/p robotic prostatectomy.  1) SL IVF 2) Ambulate, Incentive spirometry 3) Transition to oral pain medication 4) Dulcolax suppository 5) D/C pelvic drain 6) Plan for likely discharge later today   Pryor Curia. MD   LOS: 1 day   Tationna Fullard,LES 08/31/2015, 7:15 AM

## 2015-08-31 NOTE — Care Management Note (Signed)
Case Management Note  Patient Details  Name: Gregory Rhodes MRN: [id number] Date of Birth: [birth year]  Subjective/Objective:   64 y/o m admitted w/Prostate Ca. From home.                 Action/Plan:d/c home no needs or orders.   Expected Discharge Date:                  Expected Discharge Plan:  Home/Self Care  In-House Referral:     Discharge planning Services  CM Consult  Post Acute Care Choice:    Choice offered to:     DME Arranged:    DME Agency:     HH Arranged:    [place] Agency:     Status of Service:  Completed, signed off  Medicare Important Message Given:    Date Medicare IM Given:    Medicare IM give by:    Date Additional Medicare IM Given:    Additional Medicare Important Message give by:     If discussed at [place] of Stay Meetings, dates discussed:    Additional Comments:  Dessa Phi, RN 08/31/2015, 4:08 PM

## 2015-08-31 NOTE — Progress Notes (Signed)
Instructed patient's wife on foley catheter care, cleaning site, and how to empty bag, also instructed to keep bag below bladder level, verbalized understanding, pt's wife declined leg bag stating that pt will be at home until f/u appt to remove

## 2015-08-31 NOTE — Discharge Summary (Signed)
  Date of admission: 08/30/2015  Date of discharge: 08/31/2015  Admission diagnosis: Prostate Cancer  Discharge diagnosis: Prostate Cancer  History and Physical: For full details, please see admission history and physical. Briefly, Gregory Rhodes is a 64 y.o. gentleman with localized prostate cancer.  After discussing management/treatment options, he elected to proceed with surgical treatment.  Hospital Course: Gregory Rhodes was taken to the operating room on 08/30/2015 and underwent a robotic assisted laparoscopic radical prostatectomy. He tolerated this procedure well and without complications. Postoperatively, he was able to be transferred to a regular hospital room following recovery from anesthesia.  He was able to begin ambulating the night of surgery. He remained hemodynamically stable overnight.  He had excellent urine output with appropriately minimal output from his pelvic drain and his pelvic drain was removed on POD #1.  He was transitioned to oral pain medication, tolerated a clear liquid diet, and had met all discharge criteria and was able to be discharged home later on POD#1.  Laboratory values:  Recent Labs  08/30/15 1518 08/31/15 0528  HGB 15.4 13.5  HCT 44.5 39.5    Disposition: Home  Discharge instruction: He was instructed to be ambulatory but to refrain from heavy lifting, strenuous activity, or driving. He was instructed on urethral catheter care.  Discharge medications:     Medication List    TAKE these medications        allopurinol 100 MG tablet  Commonly known as:  ZYLOPRIM  Take 200 mg by mouth daily.     HYDROcodone-acetaminophen 5-325 MG tablet  Commonly known as:  NORCO  Take 1-2 tablets by mouth every 6 (six) hours as needed.     sulfamethoxazole-trimethoprim 800-160 MG tablet  Commonly known as:  BACTRIM DS,SEPTRA DS  Take 1 tablet by mouth 2 (two) times daily. Start the day prior to foley removal appointment        Followup: He will  followup in 1 week for catheter removal and to discuss his surgical pathology results.

## 2015-08-31 NOTE — Progress Notes (Signed)
On-call provider paged d/t increase in patient's pain. Bladder scanned < 20 cc in bladder. Instructed to gently hand irrigate. B&O suppository given.

## 2021-03-08 DIAGNOSIS — Z03818 Encounter for observation for suspected exposure to other biological agents ruled out: Secondary | ICD-10-CM | POA: Diagnosis not present

## 2021-03-08 DIAGNOSIS — R059 Cough, unspecified: Secondary | ICD-10-CM | POA: Diagnosis not present

## 2021-03-08 DIAGNOSIS — R430 Anosmia: Secondary | ICD-10-CM | POA: Diagnosis not present

## 2021-03-08 DIAGNOSIS — J069 Acute upper respiratory infection, unspecified: Secondary | ICD-10-CM | POA: Diagnosis not present

## 2021-03-25 DIAGNOSIS — R32 Unspecified urinary incontinence: Secondary | ICD-10-CM | POA: Diagnosis not present

## 2021-03-25 DIAGNOSIS — E7800 Pure hypercholesterolemia, unspecified: Secondary | ICD-10-CM | POA: Diagnosis not present

## 2021-03-25 DIAGNOSIS — M109 Gout, unspecified: Secondary | ICD-10-CM | POA: Diagnosis not present

## 2021-04-15 DIAGNOSIS — R8271 Bacteriuria: Secondary | ICD-10-CM | POA: Diagnosis not present

## 2021-04-15 DIAGNOSIS — N393 Stress incontinence (female) (male): Secondary | ICD-10-CM | POA: Diagnosis not present

## 2021-07-17 DIAGNOSIS — C61 Malignant neoplasm of prostate: Secondary | ICD-10-CM | POA: Diagnosis not present

## 2021-07-24 DIAGNOSIS — N5201 Erectile dysfunction due to arterial insufficiency: Secondary | ICD-10-CM | POA: Diagnosis not present

## 2021-07-24 DIAGNOSIS — C61 Malignant neoplasm of prostate: Secondary | ICD-10-CM | POA: Diagnosis not present

## 2021-07-24 DIAGNOSIS — N393 Stress incontinence (female) (male): Secondary | ICD-10-CM | POA: Diagnosis not present

## 2021-07-24 DIAGNOSIS — R8271 Bacteriuria: Secondary | ICD-10-CM | POA: Diagnosis not present

## 2021-09-20 DIAGNOSIS — Z8546 Personal history of malignant neoplasm of prostate: Secondary | ICD-10-CM | POA: Diagnosis not present

## 2021-09-20 DIAGNOSIS — G629 Polyneuropathy, unspecified: Secondary | ICD-10-CM | POA: Diagnosis not present

## 2021-10-02 DIAGNOSIS — M109 Gout, unspecified: Secondary | ICD-10-CM | POA: Diagnosis not present

## 2021-10-02 DIAGNOSIS — M79671 Pain in right foot: Secondary | ICD-10-CM | POA: Diagnosis not present

## 2021-10-02 DIAGNOSIS — G629 Polyneuropathy, unspecified: Secondary | ICD-10-CM | POA: Diagnosis not present

## 2021-10-24 DIAGNOSIS — M109 Gout, unspecified: Secondary | ICD-10-CM | POA: Diagnosis not present

## 2022-03-28 ENCOUNTER — Other Ambulatory Visit: Payer: Self-pay | Admitting: Family Medicine

## 2022-03-28 DIAGNOSIS — Z136 Encounter for screening for cardiovascular disorders: Secondary | ICD-10-CM | POA: Diagnosis not present

## 2022-03-28 DIAGNOSIS — C61 Malignant neoplasm of prostate: Secondary | ICD-10-CM | POA: Diagnosis not present

## 2022-03-28 DIAGNOSIS — E7800 Pure hypercholesterolemia, unspecified: Secondary | ICD-10-CM | POA: Diagnosis not present

## 2022-03-28 DIAGNOSIS — Z0000 Encounter for general adult medical examination without abnormal findings: Secondary | ICD-10-CM | POA: Diagnosis not present

## 2022-03-28 DIAGNOSIS — M109 Gout, unspecified: Secondary | ICD-10-CM | POA: Diagnosis not present

## 2022-03-28 DIAGNOSIS — N393 Stress incontinence (female) (male): Secondary | ICD-10-CM | POA: Diagnosis not present

## 2022-03-28 DIAGNOSIS — Z1159 Encounter for screening for other viral diseases: Secondary | ICD-10-CM | POA: Diagnosis not present

## 2022-04-07 ENCOUNTER — Ambulatory Visit
Admission: RE | Admit: 2022-04-07 | Discharge: 2022-04-07 | Disposition: A | Discharge status: Home / Self Care | Payer: PPO | Source: Ambulatory Visit | Attending: Family Medicine | Admitting: Family Medicine

## 2022-04-07 DIAGNOSIS — Z136 Encounter for screening for cardiovascular disorders: Secondary | ICD-10-CM

## 2022-06-21 IMAGING — US US ABDOMINAL AORTA SCREENING AAA
1 series · 10 of 10 positions shown · non-contrast
Comparison: CT abdomen and pelvis May 08, 2010

CLINICAL DATA: Abdominal aortic aneurysm screening

EXAM:
US ABDOMINAL AORTA MEDICARE SCREENING
TECHNIQUE: Ultrasound examination of the abdominal aorta was performed as a
screening evaluation for abdominal aortic aneurysm.

[Series 1: us abdominal aorta screening aaa · 0.30mm/px · 10 of 10 slices shown]
[im 1/10]
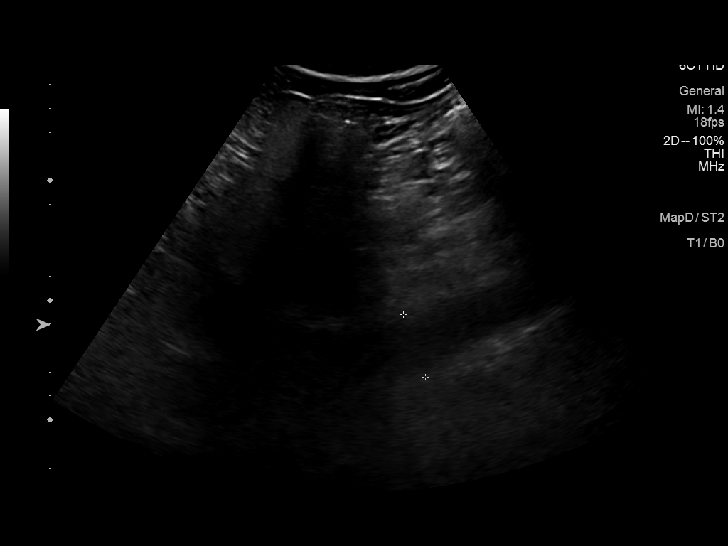
[im 2/10]
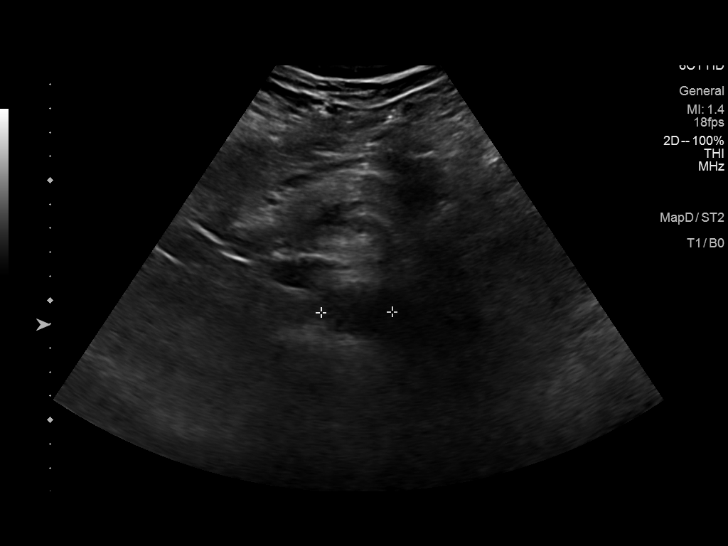
[im 3/10]
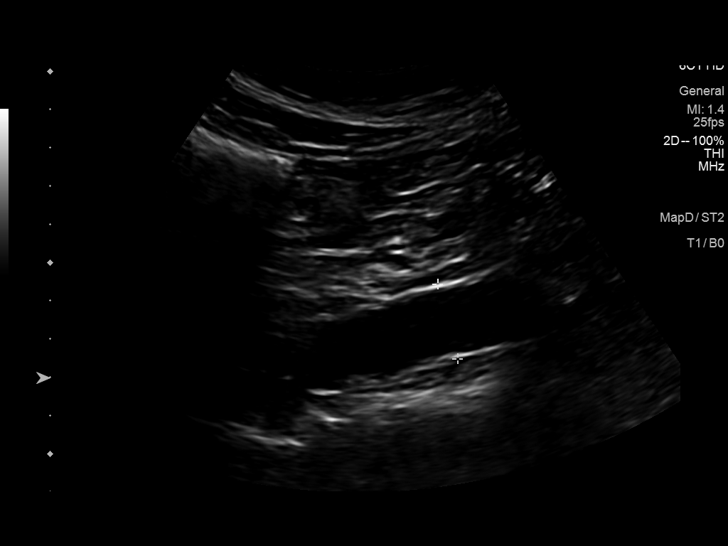
[im 4/10]
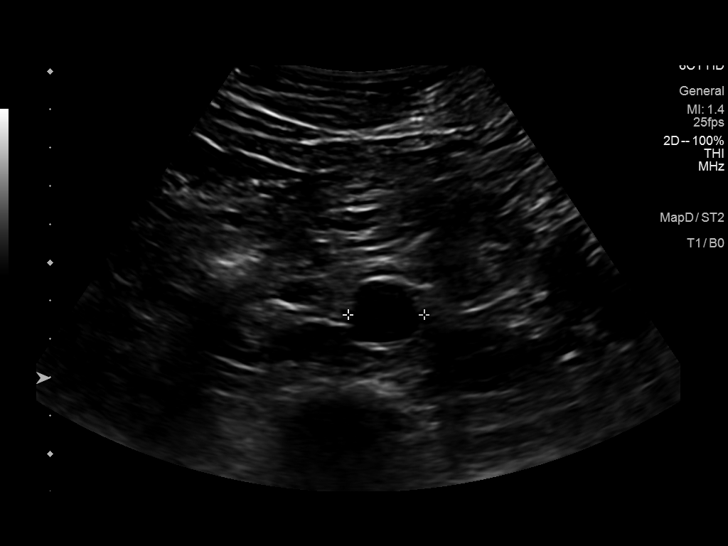
[im 5/10]
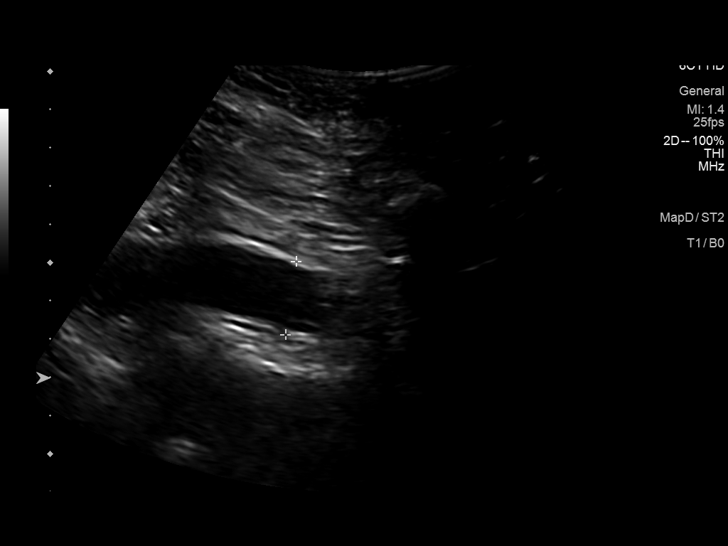
[im 6/10]
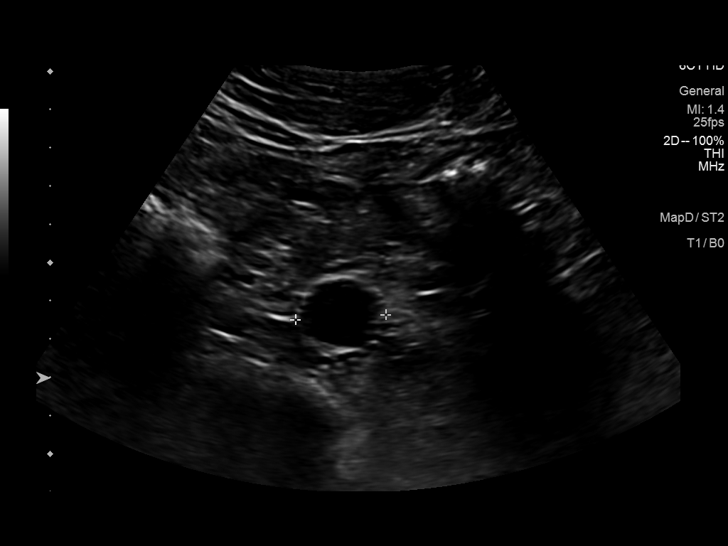
[im 7/10]
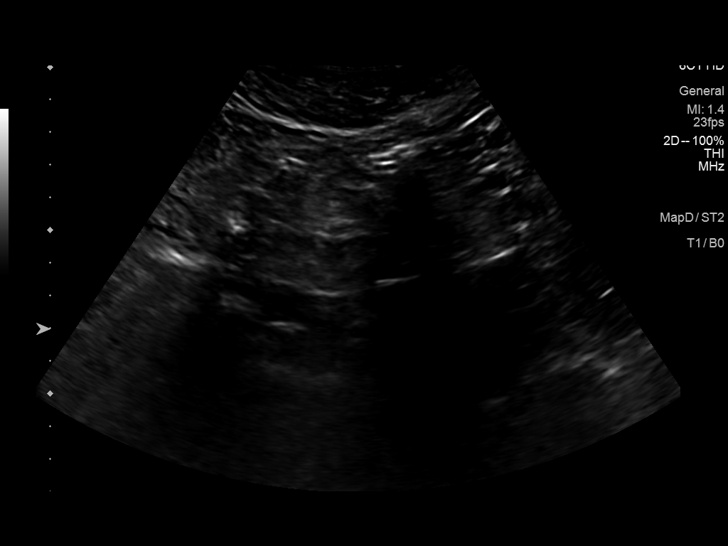
[im 8/10]
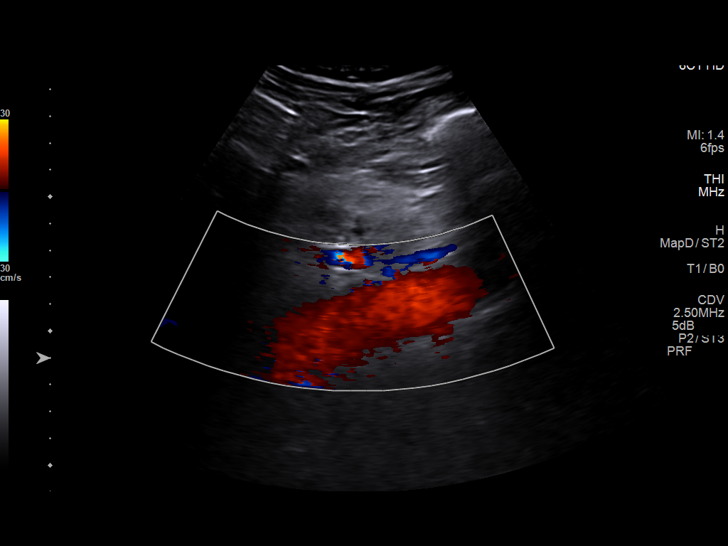
[im 9/10]
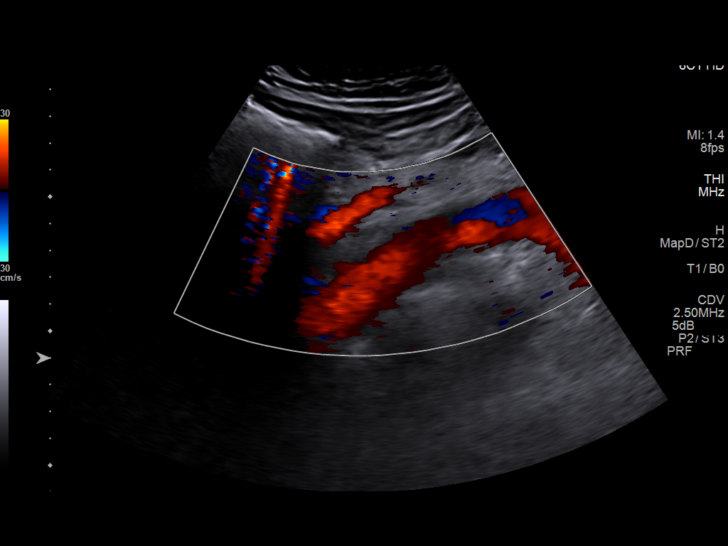
[im 10/10]
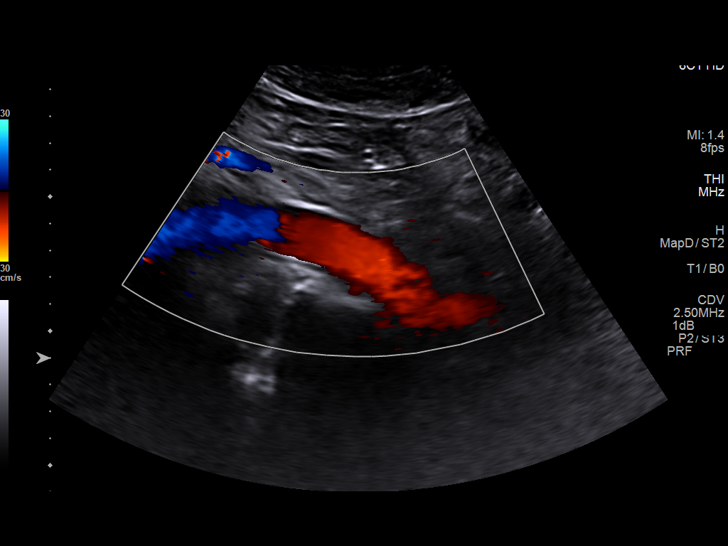

[10 of 10 positions shown; findings below may reference images not displayed]

FINDINGS: Abdominal aortic measurements as follows:

Proximal:  2.8 AP/3 transverse cm

Mid:  2 AP/2 transverse cm

Distal:  1.9 AP/2.4 transverse cm
IMPRESSION: Proximal abdominal aorta measuring maximum 3 cm. Recommend follow-up
ultrasound every 2 years. This recommendation follows ACR consensus
guidelines: White Paper of the ACR Incidental Findings Committee II

## 2022-07-17 DIAGNOSIS — C61 Malignant neoplasm of prostate: Secondary | ICD-10-CM | POA: Diagnosis not present

## 2022-07-30 DIAGNOSIS — R8271 Bacteriuria: Secondary | ICD-10-CM | POA: Diagnosis not present

## 2022-07-30 DIAGNOSIS — N393 Stress incontinence (female) (male): Secondary | ICD-10-CM | POA: Diagnosis not present

## 2022-07-30 DIAGNOSIS — C61 Malignant neoplasm of prostate: Secondary | ICD-10-CM | POA: Diagnosis not present

## 2022-10-11 DIAGNOSIS — U071 COVID-19: Secondary | ICD-10-CM | POA: Diagnosis not present

## 2022-11-17 DIAGNOSIS — H2513 Age-related nuclear cataract, bilateral: Secondary | ICD-10-CM | POA: Diagnosis not present

## 2022-11-17 DIAGNOSIS — H43393 Other vitreous opacities, bilateral: Secondary | ICD-10-CM | POA: Diagnosis not present

## 2022-12-03 DIAGNOSIS — Z86010 Personal history of colonic polyps: Secondary | ICD-10-CM | POA: Diagnosis not present

## 2022-12-03 DIAGNOSIS — Z09 Encounter for follow-up examination after completed treatment for conditions other than malignant neoplasm: Secondary | ICD-10-CM | POA: Diagnosis not present

## 2022-12-03 DIAGNOSIS — D124 Benign neoplasm of descending colon: Secondary | ICD-10-CM | POA: Diagnosis not present

## 2022-12-03 DIAGNOSIS — K5730 Diverticulosis of large intestine without perforation or abscess without bleeding: Secondary | ICD-10-CM | POA: Diagnosis not present

## 2022-12-03 DIAGNOSIS — D125 Benign neoplasm of sigmoid colon: Secondary | ICD-10-CM | POA: Diagnosis not present

## 2022-12-03 DIAGNOSIS — D123 Benign neoplasm of transverse colon: Secondary | ICD-10-CM | POA: Diagnosis not present

## 2022-12-03 DIAGNOSIS — K648 Other hemorrhoids: Secondary | ICD-10-CM | POA: Diagnosis not present

## 2022-12-05 DIAGNOSIS — D123 Benign neoplasm of transverse colon: Secondary | ICD-10-CM | POA: Diagnosis not present

## 2022-12-05 DIAGNOSIS — D125 Benign neoplasm of sigmoid colon: Secondary | ICD-10-CM | POA: Diagnosis not present

## 2022-12-05 DIAGNOSIS — D124 Benign neoplasm of descending colon: Secondary | ICD-10-CM | POA: Diagnosis not present

## 2023-01-05 DIAGNOSIS — M109 Gout, unspecified: Secondary | ICD-10-CM | POA: Diagnosis not present

## 2023-01-05 DIAGNOSIS — M7022 Olecranon bursitis, left elbow: Secondary | ICD-10-CM | POA: Diagnosis not present

## 2023-01-12 DIAGNOSIS — M79602 Pain in left arm: Secondary | ICD-10-CM | POA: Diagnosis not present

## 2023-05-08 DIAGNOSIS — N1831 Chronic kidney disease, stage 3a: Secondary | ICD-10-CM | POA: Diagnosis not present

## 2023-05-08 DIAGNOSIS — I714 Abdominal aortic aneurysm, without rupture, unspecified: Secondary | ICD-10-CM | POA: Diagnosis not present

## 2023-05-08 DIAGNOSIS — N393 Stress incontinence (female) (male): Secondary | ICD-10-CM | POA: Diagnosis not present

## 2023-05-08 DIAGNOSIS — E7800 Pure hypercholesterolemia, unspecified: Secondary | ICD-10-CM | POA: Diagnosis not present

## 2023-05-08 DIAGNOSIS — Z1211 Encounter for screening for malignant neoplasm of colon: Secondary | ICD-10-CM | POA: Diagnosis not present

## 2023-05-08 DIAGNOSIS — R252 Cramp and spasm: Secondary | ICD-10-CM | POA: Diagnosis not present

## 2023-05-08 DIAGNOSIS — Z1331 Encounter for screening for depression: Secondary | ICD-10-CM | POA: Diagnosis not present

## 2023-05-08 DIAGNOSIS — M109 Gout, unspecified: Secondary | ICD-10-CM | POA: Diagnosis not present

## 2023-05-08 DIAGNOSIS — Z0000 Encounter for general adult medical examination without abnormal findings: Secondary | ICD-10-CM | POA: Diagnosis not present

## 2023-07-29 DIAGNOSIS — C61 Malignant neoplasm of prostate: Secondary | ICD-10-CM | POA: Diagnosis not present

## 2023-08-05 DIAGNOSIS — N393 Stress incontinence (female) (male): Secondary | ICD-10-CM | POA: Diagnosis not present

## 2023-08-05 DIAGNOSIS — C61 Malignant neoplasm of prostate: Secondary | ICD-10-CM | POA: Diagnosis not present

## 2023-08-05 DIAGNOSIS — R8271 Bacteriuria: Secondary | ICD-10-CM | POA: Diagnosis not present

## 2024-05-02 ENCOUNTER — Other Ambulatory Visit: Payer: Self-pay | Admitting: Family Medicine

## 2024-05-02 DIAGNOSIS — I714 Abdominal aortic aneurysm, without rupture, unspecified: Secondary | ICD-10-CM

## 2024-05-04 ENCOUNTER — Inpatient Hospital Stay
Admission: RE | Admit: 2024-05-04 | Discharge: 2024-05-04 | Discharge status: Home / Self Care | Source: Ambulatory Visit | Attending: Family Medicine | Admitting: Family Medicine

## 2024-05-04 DIAGNOSIS — I714 Abdominal aortic aneurysm, without rupture, unspecified: Secondary | ICD-10-CM

## 2024-05-04 DIAGNOSIS — Z136 Encounter for screening for cardiovascular disorders: Secondary | ICD-10-CM | POA: Diagnosis not present

## 2024-05-06 ENCOUNTER — Encounter: Payer: Self-pay | Admitting: Advanced Practice Midwife

## 2024-06-06 DIAGNOSIS — C61 Malignant neoplasm of prostate: Secondary | ICD-10-CM | POA: Diagnosis not present

## 2024-06-06 DIAGNOSIS — N1831 Chronic kidney disease, stage 3a: Secondary | ICD-10-CM | POA: Diagnosis not present

## 2024-06-06 DIAGNOSIS — R252 Cramp and spasm: Secondary | ICD-10-CM | POA: Diagnosis not present

## 2024-06-06 DIAGNOSIS — Z0000 Encounter for general adult medical examination without abnormal findings: Secondary | ICD-10-CM | POA: Diagnosis not present

## 2024-06-06 DIAGNOSIS — H6123 Impacted cerumen, bilateral: Secondary | ICD-10-CM | POA: Diagnosis not present

## 2024-06-06 DIAGNOSIS — N393 Stress incontinence (female) (male): Secondary | ICD-10-CM | POA: Diagnosis not present

## 2024-06-06 DIAGNOSIS — Z8679 Personal history of other diseases of the circulatory system: Secondary | ICD-10-CM | POA: Diagnosis not present

## 2024-06-06 DIAGNOSIS — M109 Gout, unspecified: Secondary | ICD-10-CM | POA: Diagnosis not present

## 2024-06-06 DIAGNOSIS — E7800 Pure hypercholesterolemia, unspecified: Secondary | ICD-10-CM | POA: Diagnosis not present

## 2024-06-06 DIAGNOSIS — Z1331 Encounter for screening for depression: Secondary | ICD-10-CM | POA: Diagnosis not present

## 2024-07-16 ENCOUNTER — Emergency Department (HOSPITAL_COMMUNITY)

## 2024-07-16 ENCOUNTER — Encounter (HOSPITAL_COMMUNITY): Payer: Self-pay

## 2024-07-16 ENCOUNTER — Other Ambulatory Visit: Payer: Self-pay

## 2024-07-16 ENCOUNTER — Emergency Department (HOSPITAL_COMMUNITY)
Admission: EM | Admit: 2024-07-16 | Discharge: 2024-07-16 | Disposition: A | Discharge status: Home / Self Care | Attending: Emergency Medicine | Admitting: Emergency Medicine

## 2024-07-16 DIAGNOSIS — S0993XA Unspecified injury of face, initial encounter: Secondary | ICD-10-CM | POA: Diagnosis not present

## 2024-07-16 DIAGNOSIS — S0990XA Unspecified injury of head, initial encounter: Secondary | ICD-10-CM | POA: Diagnosis not present

## 2024-07-16 DIAGNOSIS — Z859 Personal history of malignant neoplasm, unspecified: Secondary | ICD-10-CM | POA: Diagnosis not present

## 2024-07-16 DIAGNOSIS — Z043 Encounter for examination and observation following other accident: Secondary | ICD-10-CM | POA: Diagnosis not present

## 2024-07-16 DIAGNOSIS — G9389 Other specified disorders of brain: Secondary | ICD-10-CM | POA: Diagnosis not present

## 2024-07-16 DIAGNOSIS — Z23 Encounter for immunization: Secondary | ICD-10-CM | POA: Diagnosis not present

## 2024-07-16 DIAGNOSIS — W19XXXA Unspecified fall, initial encounter: Secondary | ICD-10-CM | POA: Diagnosis not present

## 2024-07-16 DIAGNOSIS — S0181XA Laceration without foreign body of other part of head, initial encounter: Secondary | ICD-10-CM | POA: Insufficient documentation

## 2024-07-16 DIAGNOSIS — W06XXXA Fall from bed, initial encounter: Secondary | ICD-10-CM | POA: Diagnosis not present

## 2024-07-16 DIAGNOSIS — M5030 Other cervical disc degeneration, unspecified cervical region: Secondary | ICD-10-CM | POA: Diagnosis not present

## 2024-07-16 MED ORDER — ACETAMINOPHEN 500 MG PO TABS
1000.0000 mg | ORAL_TABLET | Freq: Once | ORAL | Status: AC
  Administered 2024-07-16: 1000 mg via ORAL
  Filled 2024-07-16: qty 2

## 2024-07-16 MED ORDER — CEPHALEXIN 500 MG PO CAPS: 500.0000 mg | ORAL_CAPSULE | Freq: Four times a day (QID) | ORAL | 0 refills | Status: AC

## 2024-07-16 MED ORDER — LIDOCAINE-EPINEPHRINE (PF) 2 %-1:200000 IJ SOLN
20.0000 mL | Freq: Once | INTRAMUSCULAR | Status: AC
  Administered 2024-07-16: 20 mL
  Filled 2024-07-16: qty 20

## 2024-07-16 MED ORDER — TETANUS-DIPHTH-ACELL PERTUSSIS 5-2.5-18.5 LF-MCG/0.5 IM SUSY
0.5000 mL | PREFILLED_SYRINGE | Freq: Once | INTRAMUSCULAR | Status: AC
  Administered 2024-07-16: 0.5 mL via INTRAMUSCULAR
  Filled 2024-07-16: qty 0.5

## 2024-07-16 NOTE — ED Notes (Signed)
 ED Provider at bedside.

## 2024-07-16 NOTE — ED Notes (Signed)
 Patient transported to CT

## 2024-07-16 NOTE — ED Notes (Signed)
 Pt back in room from CT

## 2024-07-16 NOTE — ED Notes (Signed)
 Chris PA at bedside to suture laceration.

## 2024-07-16 NOTE — ED Provider Notes (Signed)
 [place] [facility] Provider Note   CSN: [id number] Arrival date & time: 07/16/24  9479     Patient presents with: Laceration   Gregory Rhodes is a 73 y.o. male with history of arthritis, gout, cancer.  Patient presents to ED for evaluation of fall and laceration to the forehead.  States that he fell out of bed this morning, rolled out of bed.  States that he struck his head on a nightstand.  He denies LOC.  Denies preceding chest pain or shortness of breath.  Denies blood thinners.  He arrives complaining of a headache and has a laceration above his left eye.  He is unsure of his tetanus update.  He denies neck pain, back pain, eye pain.   Laceration      Prior to Admission medications   Medication Sig Start Date End Date Taking? Authorizing Provider  cephALEXin (KEFLEX) 500 MG capsule Take 1 capsule (500 mg total) by mouth 4 (four) times daily. 07/16/24  Yes Ruthell Lonni FALCON, PA-C  allopurinol  (ZYLOPRIM ) 100 MG tablet Take 200 mg by mouth daily.    [provider]  HYDROcodone -acetaminophen  (NORCO) 5-325 MG tablet Take 1-2 tablets by mouth every 6 (six) hours as needed. 08/30/15   Cory Palma, PA-C  sulfamethoxazole -trimethoprim  (BACTRIM  DS,SEPTRA  DS) 800-160 MG tablet Take 1 tablet by mouth 2 (two) times daily. Start the day prior to foley removal appointment 08/30/15   Cory Palma, PA-C    Allergies: Patient has no known allergies.    Review of Systems  Neurological:  Positive for headaches.  All other systems reviewed and are negative.   Updated Vital Signs BP 135/86   Pulse 61   Temp 97.8 F (36.6 C)   Resp 18   SpO2 94%   Physical Exam Vitals and nursing note reviewed.  Constitutional:      General: He is not in acute distress.    Appearance: He is well-developed.  HENT:     Head: Normocephalic.   Eyes:     Extraocular Movements: Extraocular movements intact.     Conjunctiva/sclera: Conjunctivae normal.      Pupils: Pupils are equal, round, and reactive to light.     Comments: Nonpainful EOMs  Cardiovascular:     Rate and Rhythm: Normal rate and regular rhythm.     Heart sounds: No murmur heard. Pulmonary:     Effort: Pulmonary effort is normal. No respiratory distress.     Breath sounds: Normal breath sounds.  Abdominal:     Palpations: Abdomen is soft.     Tenderness: There is no abdominal tenderness.  Musculoskeletal:        General: No swelling.     Cervical back: Neck supple.  Skin:    General: Skin is warm and dry.     Capillary Refill: Capillary refill takes less than 2 seconds.     Comments: Linear laceration superiorly to left orbit.  Neurological:     Mental Status: He is alert and oriented to person, place, and time. Mental status is at baseline.     Comments: Reassuring neurological examination, no focal neurodeficits.  Patient alert and oriented x 4.  Psychiatric:        Mood and Affect: Mood normal.     (all labs ordered are listed, but only abnormal results are displayed) Labs Reviewed - No data to display  EKG: None  Radiology: CT Cervical Spine Wo Contrast Result Date: 07/16/2024 CLINICAL DATA:  73 year old male  status post fall from bed. Struck left head on night stand. EXAM: CT CERVICAL SPINE WITHOUT CONTRAST TECHNIQUE: Multidetector CT imaging of the cervical spine was performed without intravenous contrast. Multiplanar CT image reconstructions were also generated. RADIATION DOSE REDUCTION: This exam was performed according to the departmental dose-optimization program which includes automated exposure control, adjustment of the mA and/or kV according to patient size and/or use of iterative reconstruction technique. COMPARISON:  CT head and face today. FINDINGS: Alignment: Straightening of cervical lordosis. Cervicothoracic junction alignment is within normal limits. Bilateral posterior element alignment is within normal limits. Skull base and vertebrae: Bone  mineralization is within normal limits for age. Visualized skull base is intact. No atlanto-occipital dissociation. C1 and C2 appear intact and aligned. No acute osseous abnormality identified. Soft tissues and spinal canal: No prevertebral fluid or swelling. No visible canal hematoma. Negative visible noncontrast neck soft tissues. Disc levels: Chronic cervical spine disc and endplate degeneration C4-C5 through C6-C7. Intermittent facet degeneration. No significant cervical spinal stenosis by CT. Upper chest: Negative visible noncontrast thoracic inlet. IMPRESSION: 1. No acute traumatic injury identified in the cervical spine. 2. Chronic cervical spine degeneration. Electronically Signed   By: VEAR Hurst M.D.   On: 07/16/2024 06:18   CT Maxillofacial Wo Contrast Result Date: 07/16/2024 CLINICAL DATA:  73 year old male status post fall from bed. Struck left head on night stand. EXAM: CT MAXILLOFACIAL WITHOUT CONTRAST TECHNIQUE: Multidetector CT imaging of the maxillofacial structures was performed. Multiplanar CT image reconstructions were also generated. RADIATION DOSE REDUCTION: This exam was performed according to the departmental dose-optimization program which includes automated exposure control, adjustment of the mA and/or kV according to patient size and/or use of iterative reconstruction technique. COMPARISON:  CT head and cervical spine today. FINDINGS: Osseous: Mandible intact and normally located. No acute dental finding identified. Bilateral maxilla, zygoma, pterygoid, and nasal bones appear intact. Intact skull base. Orbits: Intact orbital walls. Globes and orbits soft tissues appears symmetric and normal. Sinuses: Well aerated. Small right maxillary alveolar recess retention cyst. Tympanic cavities and mastoids appear clear. Soft tissues: Trace retained secretions in the pharynx. Negative visible noncontrast larynx, parapharyngeal spaces, retropharyngeal space, sublingual space, submandibular spaces,  masticator and parotid spaces. Left supraorbital scalp soft tissue injury reported on the head CT separately. Limited intracranial: Stable to that reported separately. IMPRESSION: Left supraorbital scalp soft tissue injury, see Head CT reported separately. No superimposed acute traumatic injury identified in the Face. Electronically Signed   By: VEAR Hurst M.D.   On: 07/16/2024 06:16   CT Head Wo Contrast Result Date: 07/16/2024 CLINICAL DATA:  73 year old male status post fall from bed. Struck left head on night stand. EXAM: CT HEAD WITHOUT CONTRAST TECHNIQUE: Contiguous axial images were obtained from the base of the skull through the vertex without intravenous contrast. RADIATION DOSE REDUCTION: This exam was performed according to the departmental dose-optimization program which includes automated exposure control, adjustment of the mA and/or kV according to patient size and/or use of iterative reconstruction technique. COMPARISON:  CT face and cervical spine today reported separately. FINDINGS: Brain: Cerebral volume is within normal limits for age. Nonspecific dystrophic calcifications along the posterolateral left temporal lobe including dural and parenchymal (series 3, image 12). No regional mass effect or edema. No midline shift, ventriculomegaly, mass effect, evidence of mass lesion, intracranial hemorrhage or evidence of cortically based acute infarction. Small but circumscribed right superior cerebellar infarct appears chronic on series 6, image 58. Mild heterogeneity in the deep gray nuclei including the left  thalamus. Maintained gray-white differentiation otherwise. Vascular: No suspicious intracranial vascular hyperdensity. Faint basal ganglia vascular calcifications on the left. Skull: Skull base motion artifact. No acute osseous abnormality identified. Face reported separately. Sinuses/Orbits: Visualized paranasal sinuses and mastoids are well aerated. Other: Left supraorbital, lateral left forehead  scalp soft tissue deficiency and injury with overlying dressing material (series 5, image 21). Underlying left frontal bone and sinus appear intact. Small volume hematoma tracking cephalad from the skin lesion, up to 4 mm in thickness. Calvarium appears intact. Face reported separately. IMPRESSION: 1. Left forehead scalp soft tissue injury. No skull fracture. Face CT reported separately. 2. No acute intracranial abnormality. Small chronic appearing right cerebellar infarct. Chronic dystrophic appearing calcifications at the posterior left temporal lobe. Electronically Signed   By: VEAR Hurst M.D.   On: 07/16/2024 06:13    .Laceration Repair  Date/Time: 07/16/2024 6:44 AM  Performed by: Ruthell Lonni FALCON, PA-C Authorized by: Ruthell Lonni FALCON, PA-C   Consent:    Consent obtained:  Verbal   Consent given by:  Patient   Risks, benefits, and alternatives were discussed: yes     Risks discussed:  Infection, nerve damage, need for additional repair, pain, poor cosmetic result and poor wound healing   Alternatives discussed:  No treatment Universal protocol:    Patient identity confirmed:  Verbally with patient Anesthesia:    Anesthesia method:  Local infiltration   Local anesthetic:  Lidocaine  2% WITH epi Laceration details:    Location: Left forehead.   Length (cm):  4 Treatment:    Area cleansed with:  Saline   Amount of cleaning:  Extensive   Irrigation solution:  Sterile water  Skin repair:    Repair method:  Sutures   Suture size:  3-0   Suture material:  Prolene   Suture technique:  Simple interrupted   Number of sutures:  5 Approximation:    Approximation:  Close Repair type:    Repair type:  Simple Post-procedure details:    Dressing:  Non-adherent dressing   Procedure completion:  Tolerated well, no immediate complications    Medications Ordered in the ED  lidocaine -EPINEPHrine  (XYLOCAINE  W/EPI) 2 %-1:200000 (PF) injection 20 mL (20 mLs Other Given 07/16/24 0556)  Tdap  (BOOSTRIX) injection 0.5 mL (0.5 mLs Intramuscular Given 07/16/24 0557)     Medical Decision Making Amount and/or Complexity of Data Reviewed Radiology: ordered.  Risk Prescription drug management.   This is a 73 year old male presenting to the ED after falling out of bed this morning.  On exam, he has a laceration above his left eye.  He is afebrile and nontachycardic.  His lung sounds are clear bilaterally, there is no hypoxia.  Abdomen soft and compressible.  Neurological examination at baseline.  Centimeter laceration superiorly to left orbit.  Nonpainful EOMs.  CT head, cervical spine, maxillofacial collected.  CT imaging is unremarkable for acute process.  Tetanus updated.  Patient laceration repaired per procedure note.  At this time patient will be discharged home.  Advised to follow-up in 7 to 10 days with PCP for wound check, suture removal.  He was given return precautions and he and his wife voiced understanding.  The patient is stable to discharge home at this time.     Final diagnoses:  Fall, initial encounter  Injury of head, initial encounter  Laceration of forehead, initial encounter    ED Discharge Orders          Ordered    cephALEXin (KEFLEX) 500 MG capsule  4 times daily        07/16/24 0648               Ruthell Lonni FALCON, PA-C 07/16/24 [id number]    Trine Raynell Moder, MD 07/16/24 [phone]

## 2024-07-16 NOTE — ED Triage Notes (Signed)
 Pt presents via EMS from home c/o laceration to left side of forehead following hitting head on nightstand when rolled out of bed. EMS reports no LOC, A&O x4. Pt c/o headache. PERRLA.

## 2024-07-16 NOTE — Discharge Instructions (Signed)
 It was a pleasure taking part in your care.  As we discussed, CT scans tonight were unremarkable and showed no acute process or injury.  Please follow-up with your PCP in 7 to 10 days for removal of sutures.  Please return to the ED with any new symptoms.  Please read attached guide concerning laceration care at home. You may take keflex 4 times a day for 5 days for wound infection prevention.

## 2024-07-22 DIAGNOSIS — Z4802 Encounter for removal of sutures: Secondary | ICD-10-CM | POA: Diagnosis not present

## 2024-07-27 DIAGNOSIS — C61 Malignant neoplasm of prostate: Secondary | ICD-10-CM | POA: Diagnosis not present

## 2024-08-03 DIAGNOSIS — N393 Stress incontinence (female) (male): Secondary | ICD-10-CM | POA: Diagnosis not present

## 2024-08-03 DIAGNOSIS — R399 Unspecified symptoms and signs involving the genitourinary system: Secondary | ICD-10-CM | POA: Diagnosis not present

## 2024-08-03 DIAGNOSIS — C61 Malignant neoplasm of prostate: Secondary | ICD-10-CM | POA: Diagnosis not present
